# Patient Record
Sex: Male | Born: 1955 | Race: White | Hispanic: No | Marital: Married | Smoking: Current some day smoker
Health system: Southern US, Community
[De-identification: ages and names within clinical notes are randomized; demographics above are authoritative.]

## PROBLEM LIST (undated history)

## (undated) DIAGNOSIS — I4510 Unspecified right bundle-branch block: Secondary | ICD-10-CM

## (undated) DIAGNOSIS — Z9889 Other specified postprocedural states: Secondary | ICD-10-CM

## (undated) DIAGNOSIS — C801 Malignant (primary) neoplasm, unspecified: Secondary | ICD-10-CM

## (undated) DIAGNOSIS — I712 Thoracic aortic aneurysm, without rupture: Secondary | ICD-10-CM

## (undated) DIAGNOSIS — I10 Essential (primary) hypertension: Secondary | ICD-10-CM

## (undated) DIAGNOSIS — T8859XA Other complications of anesthesia, initial encounter: Secondary | ICD-10-CM

## (undated) DIAGNOSIS — I7121 Aneurysm of the ascending aorta, without rupture: Secondary | ICD-10-CM

## (undated) DIAGNOSIS — I499 Cardiac arrhythmia, unspecified: Secondary | ICD-10-CM

## (undated) DIAGNOSIS — F319 Bipolar disorder, unspecified: Secondary | ICD-10-CM

## (undated) DIAGNOSIS — T4145XA Adverse effect of unspecified anesthetic, initial encounter: Secondary | ICD-10-CM

## (undated) DIAGNOSIS — F329 Major depressive disorder, single episode, unspecified: Secondary | ICD-10-CM

## (undated) DIAGNOSIS — M1990 Unspecified osteoarthritis, unspecified site: Secondary | ICD-10-CM

## (undated) DIAGNOSIS — K219 Gastro-esophageal reflux disease without esophagitis: Secondary | ICD-10-CM

## (undated) DIAGNOSIS — R112 Nausea with vomiting, unspecified: Secondary | ICD-10-CM

## (undated) DIAGNOSIS — F32A Depression, unspecified: Secondary | ICD-10-CM

## (undated) DIAGNOSIS — Z87442 Personal history of urinary calculi: Secondary | ICD-10-CM

## (undated) HISTORY — PX: KNEE ARTHROSCOPY: SUR90

## (undated) HISTORY — PX: OTHER SURGICAL HISTORY: SHX169

## (undated) SURGERY — REPAIR, HERNIA, INGUINAL, ADULT
Anesthesia: General | Laterality: Left

---

## 1993-05-06 HISTORY — PX: ROTATOR CUFF REPAIR: SHX139

## 1999-08-20 ENCOUNTER — Encounter: Payer: Self-pay | Admitting: Family Medicine

## 1999-08-20 ENCOUNTER — Ambulatory Visit (HOSPITAL_COMMUNITY): Admission: RE | Admit: 1999-08-20 | Discharge: 1999-08-20 | Payer: Self-pay | Admitting: Family Medicine

## 2001-11-09 ENCOUNTER — Emergency Department (HOSPITAL_COMMUNITY): Admission: EM | Admit: 2001-11-09 | Discharge: 2001-11-09 | Payer: Self-pay | Admitting: Emergency Medicine

## 2001-11-12 ENCOUNTER — Observation Stay (HOSPITAL_COMMUNITY): Admission: RE | Admit: 2001-11-12 | Discharge: 2001-11-13 | Payer: Self-pay | Admitting: Orthopedic Surgery

## 2007-05-07 HISTORY — PX: PROSTATECTOMY: SHX69

## 2007-06-29 ENCOUNTER — Ambulatory Visit (HOSPITAL_COMMUNITY): Admission: RE | Admit: 2007-06-29 | Discharge: 2007-06-29 | Payer: Self-pay | Admitting: Orthopedic Surgery

## 2009-02-21 ENCOUNTER — Ambulatory Visit (HOSPITAL_BASED_OUTPATIENT_CLINIC_OR_DEPARTMENT_OTHER): Admission: RE | Admit: 2009-02-21 | Discharge: 2009-02-21 | Payer: Self-pay | Admitting: Orthopedic Surgery

## 2009-10-04 ENCOUNTER — Ambulatory Visit: Admission: RE | Admit: 2009-10-04 | Discharge: 2009-12-28 | Payer: Self-pay | Admitting: Radiation Oncology

## 2010-01-10 ENCOUNTER — Inpatient Hospital Stay (HOSPITAL_COMMUNITY): Admission: RE | Admit: 2010-01-10 | Discharge: 2010-01-11 | Payer: Self-pay | Admitting: Urology

## 2010-01-10 ENCOUNTER — Encounter (INDEPENDENT_AMBULATORY_CARE_PROVIDER_SITE_OTHER): Payer: Self-pay | Admitting: Urology

## 2010-07-19 LAB — ABO/RH: ABO/RH(D): O NEG

## 2010-07-19 LAB — BASIC METABOLIC PANEL
BUN: 12 mg/dL (ref 6–23)
BUN: 9 mg/dL (ref 6–23)
CO2: 27 mEq/L (ref 19–32)
CO2: 27 mEq/L (ref 19–32)
Calcium: 8 mg/dL — ABNORMAL LOW (ref 8.4–10.5)
Calcium: 8.4 mg/dL (ref 8.4–10.5)
Chloride: 105 mEq/L (ref 96–112)
Chloride: 108 mEq/L (ref 96–112)
Creatinine, Ser: 1.26 mg/dL (ref 0.4–1.5)
Creatinine, Ser: 1.27 mg/dL (ref 0.4–1.5)
GFR calc Af Amer: >60 mL/min (ref >60)
GFR calc Af Amer: >60 mL/min (ref >60)
GFR calc non Af Amer: 59 mL/min — ABNORMAL LOW (ref >60)
GFR calc non Af Amer: 59 mL/min — ABNORMAL LOW (ref >60)
Glucose, Bld: 136 mg/dL — ABNORMAL HIGH (ref 70–99)
Glucose, Bld: 143 mg/dL — ABNORMAL HIGH (ref 70–99)
Potassium: 4.3 mEq/L (ref 3.5–5.1)
Potassium: 4.6 mEq/L (ref 3.5–5.1)
Sodium: 137 mEq/L (ref 135–145)
Sodium: 139 mEq/L (ref 135–145)

## 2010-07-19 LAB — COMPREHENSIVE METABOLIC PANEL
Albumin: 4.2 g/dL (ref 3.5–5.2)
Alkaline Phosphatase: 58 U/L (ref 39–117)
BUN: 16 mg/dL (ref 6–23)
Creatinine, Ser: 1.4 mg/dL (ref 0.4–1.5)
Glucose, Bld: 116 mg/dL — ABNORMAL HIGH (ref 70–99)
Potassium: 4.1 mEq/L (ref 3.5–5.1)
Total Protein: 6.8 g/dL (ref 6.0–8.3)

## 2010-07-19 LAB — DIFFERENTIAL
Eosinophils Absolute: 0 10*3/uL (ref 0.0–0.7)
Eosinophils Relative: 0 % (ref 0–5)
Lymphocytes Relative: 11 % — ABNORMAL LOW (ref 12–46)
Lymphs Abs: 0.7 10*3/uL (ref 0.7–4.0)
Monocytes Absolute: 0.6 10*3/uL (ref 0.1–1.0)
Monocytes Relative: 10 % (ref 3–12)

## 2010-07-19 LAB — CBC
HCT: 31.3 % — ABNORMAL LOW (ref 39.0–52.0)
HCT: 34 % — ABNORMAL LOW (ref 39.0–52.0)
HCT: 37.5 % — ABNORMAL LOW (ref 39.0–52.0)
Hemoglobin: 10.8 g/dL — ABNORMAL LOW (ref 13.0–17.0)
Hemoglobin: 11.7 g/dL — ABNORMAL LOW (ref 13.0–17.0)
MCH: 32.6 pg (ref 26.0–34.0)
MCH: 32.7 pg (ref 26.0–34.0)
MCH: 32 pg (ref 26.0–34.0)
MCHC: 34.3 g/dL (ref 30.0–36.0)
MCHC: 34.2 g/dL (ref 30.0–36.0)
MCV: 94.2 fL (ref 78.0–100.0)
MCV: 95.2 fL (ref 78.0–100.0)
MCV: 93.6 fL (ref 78.0–100.0)
Platelets: 142 10*3/uL — ABNORMAL LOW (ref 150–400)
Platelets: 163 10*3/uL (ref 150–400)
Platelets: 187 10*3/uL (ref 150–400)
RBC: 3.32 MIL/uL — ABNORMAL LOW (ref 4.22–5.81)
RBC: 3.57 MIL/uL — ABNORMAL LOW (ref 4.22–5.81)
RDW: 13.6 % (ref 11.5–15.5)
RDW: 13.6 % (ref 11.5–15.5)
WBC: 6.2 10*3/uL (ref 4.0–10.5)
WBC: 6.5 10*3/uL (ref 4.0–10.5)
WBC: 4.4 10*3/uL (ref 4.0–10.5)

## 2010-07-19 LAB — SURGICAL PCR SCREEN
MRSA, PCR: NEGATIVE
Staphylococcus aureus: POSITIVE — AB

## 2010-07-19 LAB — TYPE AND SCREEN
ABO/RH(D): O NEG
Antibody Screen: NEGATIVE

## 2010-08-09 LAB — POCT I-STAT 4, (NA,K, GLUC, HGB,HCT)
Glucose, Bld: 115 mg/dL — ABNORMAL HIGH (ref 70–99)
HCT: 40 % (ref 39.0–52.0)
Sodium: 138 mEq/L (ref 135–145)

## 2010-09-18 NOTE — Op Note (Signed)
Philip Stout, Philip Stout                 ACCOUNT NO.:  [id number]   MEDICAL RECORD NO.:  [id number]          PATIENT TYPE:  AMB   LOCATION:  DAY                          FACILITY:  [facility]   PHYSICIAN:  Marlowe Kays, M.D.  DATE OF BIRTH:  08-25-1955   DATE OF PROCEDURE:  06/29/2007  DATE OF DISCHARGE:                               OPERATIVE REPORT   PREOPERATIVE DIAGNOSES:  1. Torn medial and lateral menisci.  2. Osteoarthritis, left knee.   POSTOPERATIVE DIAGNOSES:  1. Torn medial and lateral menisci.  2. Osteoarthritis, left knee.   OPERATION:  Left knee arthroscopy with:  1. Partial medial and lateral meniscectomy.  2. Burring down of large osteophyte from anteromedial tibial plateau.  3. Debridement and rasping of medial femoral condyle.  4. Shaving of patella, left knee.   SURGEON:  Serafina Mitchell, MD   ASSISTANT:  Nurse.   ANESTHESIA:  General.   JUSTIFICATION FOR PROCEDURE:  Stated in diagnosis.  He is having  progressive pain in the knee.  He understands that arthroscopy is more  for the torn menisci than correction of the arthritis.   PROCEDURE IN DETAIL:  Under satisfactory general anesthesia, Ace wrap,  and knee support to right lower extremity, pneumatic tourniquet to the  left lower extremity, Esmarched out nonsterilely, inflated 325 mmHg,  thigh stabilizer applied.  Left leg was prepped from stabilizer, and  ankle with DuraPrep, draped in sterile field.  Time out performed.  Superior and medial saline inflow.  First went into the lateral portal  of the mid compartment.  Knee joint was evaluated, had a good bit of  synovitis, and disruption of the anterior horn of the medial meniscus.  This was all debrided out with the 3.5 shaver to expose the large  osteophyte.  I then used the 4 mm oval bur to shave this down until  smooth.  The medial femoral condyle had diffuse wear with 1 area almost  at full thickness.  I shaved this down slightly with a 3.5 shaver and I  also  rasped it down gently.  Posteriorly he had disruption of most of  the inner border of the medial meniscus, particularly in the  intercondylar area, which I managed with a combination of baskets and  3.5 shaver.  Looking up the medial gutter and suprapatellar area, I then  shaved his patella, which was worn on both facets.  I then reversed  portals.  Laterally he had copious synovitis in the anterior portion and  large, almost bucket handle type tear of the anterior 50% of the lateral  meniscus.  I managed this by first debriding things up with better  visualization with 3.5 shaver, and I then used arthroscopic scissors to  complete the bucket handle type tear back to about the junction of the  mid posterior third.  The cut segment was resected out with a  combination of baskets and the 3.5 shaver.  The posterior third of the  lateral meniscus was also badly worn.  I debrided this back to stable  rim with final pictures being taken.  lateral  compartment wear was  minimal.  The knee joint was then irrigated until clear and all fluid  possible removed.  The standard ports were closed with 4-0 nylon.  I  injected 20 mL of 0.5% Marcaine  with adrenalin and 4 mg of morphine through the inferior apparatus,  which I removed and this portal was closed with 4-0 nylon as well.  Betadine, Adaptic, dry sterile dressings were applied.  Tourniquet was  released.  He tolerated the procedure well and was taken to recovery in  satisfactory condition with no known complications.           ______________________________  Marlowe Kays, M.D.     JA/MEDQ  D:  06/29/2007  T:  06/30/2007  Job:  [id number]

## 2010-09-21 NOTE — Op Note (Signed)
[facility]  Patient:    FILIMON, MIRANDA Visit Number: [id number] MRN: [id number]          Service Type: SUR Location: 1S 0004 01 Attending Physician:  Marlowe Kays Page Dictated by:   Illene Labrador. Aplington, M.D. Proc. Date: 11/12/01 Admit Date:  11/12/2001 Discharge Date: 11/13/2001                             Operative Report  PREOPERATIVE DIAGNOSIS:  Complete tear, left Achilles tendon.  POSTOPERATIVE DIAGNOSIS:  Complete tear, left Achilles tendon.  OPERATION PERFORMED:  Repair of left Achilles tendon rupture utilizing Graft Jacket.  SURGEON:  Illene Labrador. Aplington, M.D.  ASSISTANTJill Side P. Mahar, P.A.-C  ANESTHESIA:  General.  PATHOLOGY AND JUSTIFICATION FOR PROCEDURE:  He sustained the rupture three days ago playing tennis. The tear was about 7 cm from the distal insertion of Achilles tendon but he simply shredded the tendon with extension of disruption of the tendon for another 7 cm proximal to it. There was no healthy tissue to bring down flaps and I was able to utilize the graft jacket instead.  DESCRIPTION OF PROCEDURE:  Satisfactory general anesthesia, pneumatic tourniquet applied in the supine position, placed in the prone position on rolls, the leg was esmarched out nonsterile and tourniquet inflated to 350 mmHg. I then prepped the leg just past the knee to toes with duraprep, draped in a sterile field. I made a gentle S shaped incision with the curve at the rupture site starting superolaterally and then crossing down to the medial heel. With careful dissection I went through the subcutaneous tissue, the sensory nerves were protected. The peritenon had a good bit of hemorrhage all the way down to the insertion of the Achilles tendon. This was opened and I kept exploring until I got to normal tendon which was over 7 cm as noted above from the main rupture site. There was a few strands of tendon remaining medially. There was  also a little bit of muscle belly present on the proximal fragment. After assessing the tear, I debrided up small areas but basically there really much to debride in terms of any discreet ends. I began trying to splice the tendon together using multiple Kessler and horizontal mattress type sutures reconstituting the tendon as well as I could at the rupture site. I then went from the rupture site proximalward for the 7 cm with a running #1 Vicryl until I reconstituted the tendon muscle up to this level as well. After assessing the tear and realizing that I could not swing down any flaps for coverage, I elected to go with the right medical Graft Jacket and actually needed to use one of each size of the two sizes in order to cover the length of the disruption. The larger rectangular piece I placed over the healthy tendon distally and wrapped it around the tendon going proximalward. I then used two 3-0 Vicryl sutures to begin a perimeter stitch distally. I then used the stitch on the lateral side going back through to catch the medial portion much as a blanket surrounding the tendon and then going from medial to lateral and up the tendon until I hit muscle and then went through the tendon and muscle. Proximally I anchored the end of the graft and muscle and aponeurosis with 3-0 Vicryl as well. I then took the second smaller piece of graft and made a postage size graft  from it and placed it on the most proximal portion of the disrupted aponeurosis and sutured it down as well with 3-0 Vicryl. The wound was then well irrigated with sterile saline. There was no peritenon to close. The subcutaneous tissue was closed with a combination of 2-0 Vicryl and 3-0 Vicryl and I then approximated the skin with two running sutures of 3-0 nylon. All of this repair was done with the ankle in plantar flexion and we then placed them in the short leg plaster splint cast which was well padded with Betadine, Adaptic,  ABD pads, sterile Webril and Kerlix followed by an Ace wrap. I also infiltrated the wound with 0.5% plain Marcaine and he was given 30 mg of Toradol IV at the time of closure. The tourniquet was then released. He tolerated the procedure well and was taken to the recovery room in satisfactory condition with no known complications. Dictated by:   Illene Labrador. Aplington, M.D. Attending Physician:  Joaquin Courts DD:  11/12/01 TD:  11/16/01 Job: [id number]

## 2011-01-25 LAB — BASIC METABOLIC PANEL
BUN: 21
CO2: 29
Glucose, Bld: 91
Potassium: 4.5
Sodium: 140

## 2014-04-20 NOTE — Patient Instructions (Addendum)
Philip Stout  04/20/2014   Your procedure is scheduled on: 05/03/14   Report to [facility]  Entrance and follow signs to               [place] at 6:00 AM.   Call this number if you have problems the morning of surgery [phone]   Remember:  Do not eat food or drink liquids :After Midnight.     Take these medicines the morning of surgery with A SIP OF WATER: ALPRAZOLAM / WELLBUTRIN / OMEPRAZOLE / TYLENOL IF NEEDED                               You may not have any metal on your body including hair pins and              piercings  Do not wear jewelry, make-up, lotions, powders or perfumes.             Do not wear nail polish.  Do not shave  48 hours prior to surgery.              Men may shave face and neck.   Do not bring valuables to the hospital. [place].  Contacts, dentures or bridgework may not be worn into surgery.  Leave suitcase in the car. After surgery it may be brought to your room.     Patients discharged the day of surgery will not be allowed to drive home.  Name and phone number of your driver:  Special Instructions: N/A              Please read over the following fact sheets you were given: _____________________________________________________________________                                                     [place]  Before surgery, you can play an important role.  Because skin is not sterile, your skin needs to be as free of germs as possible.  You can reduce the number of germs on your skin by washing with CHG (chlorahexidine gluconate) soap before surgery.  CHG is an antiseptic cleaner which kills germs and bonds with the skin to continue killing germs even after washing. Please DO NOT use if you have an allergy to CHG or antibacterial soaps.  If your skin becomes reddened/irritated stop using the CHG and inform your nurse when you arrive at  Short Stay. Do not shave (including legs and underarms) for at least 48 hours prior to the first CHG shower.  You may shave your face. Please follow these instructions carefully:   1.  Shower with CHG Soap the night before surgery and the  morning of Surgery.   2.  If you choose to wash your hair, wash your hair first as usual with your  normal  Shampoo.   3.  After you shampoo, rinse your hair and body thoroughly to remove the  shampoo.  4.  Use CHG as you would any other liquid soap.  You can apply chg directly  to the skin and wash . Gently wash with scrungie or clean wascloth    5.  Apply the CHG Soap to your body ONLY FROM THE NECK DOWN.   Do not use on open                           Wound or open sores. Avoid contact with eyes, ears mouth and genitals (private parts).                        Genitals (private parts) with your normal soap.              6.  Wash thoroughly, paying special attention to the area where your surgery  will be performed.   7.  Thoroughly rinse your body with warm water from the neck down.   8.  DO NOT shower/wash with your normal soap after using and rinsing off  the CHG Soap .                9.  Pat yourself dry with a clean towel.             10.  Wear clean pajamas.             11.  Place clean sheets on your bed the night of your first shower and do not  sleep with pets.  Day of Surgery : Do not apply any lotions/deodorants the morning of surgery.  Please wear clean clothes to the hospital/surgery center.  FAILURE TO FOLLOW THESE INSTRUCTIONS MAY RESULT IN THE CANCELLATION OF YOUR SURGERY    PATIENT SIGNATURE_________________________________  ______________________________________________________________________     Philip Stout  An incentive spirometer is a tool that can help keep your lungs clear and active. This tool measures how well you are filling your lungs with each breath. Taking  long deep breaths may help reverse or decrease the chance of developing breathing (pulmonary) problems (especially infection) following:  A long period of time when you are unable to move or be active. BEFORE THE PROCEDURE   If the spirometer includes an indicator to show your best effort, your nurse or respiratory therapist will set it to a desired goal.  If possible, sit up straight or lean slightly forward. Try not to slouch.  Hold the incentive spirometer in an upright position. INSTRUCTIONS FOR USE   Sit on the edge of your bed if possible, or sit up as far as you can in bed or on a chair.  Hold the incentive spirometer in an upright position.  Breathe out normally.  Place the mouthpiece in your mouth and seal your lips tightly around it.  Breathe in slowly and as deeply as possible, raising the piston or the ball toward the top of the column.  Hold your breath for 3-5 seconds or for as long as possible. Allow the piston or ball to fall to the bottom of the column.  Remove the mouthpiece from your mouth and breathe out normally.  Rest for a few seconds and repeat Steps 1 through 7 at least 10 times every 1-2 hours when you are awake. Take your time and take a few normal breaths between deep breaths.  The spirometer may include an indicator to show your best effort. Use the indicator as a goal to work toward during  each repetition.  After each set of 10 deep breaths, practice coughing to be sure your lungs are clear. If you have an incision (the cut made at the time of surgery), support your incision when coughing by placing a pillow or rolled up towels firmly against it. Once you are able to get out of bed, walk around indoors and cough well. You may stop using the incentive spirometer when instructed by your caregiver.  RISKS AND COMPLICATIONS  Take your time so you do not get dizzy or light-headed.  If you are in pain, you may need to take or ask for pain medication before  doing incentive spirometry. It is harder to take a deep breath if you are having pain. AFTER USE  Rest and breathe slowly and easily.  It can be helpful to keep track of a log of your progress. Your caregiver can provide you with a simple table to help with this. If you are using the spirometer at home, follow these instructions: [place] IF:   You are having difficultly using the spirometer.  You have trouble using the spirometer as often as instructed.  Your pain medication is not giving enough relief while using the spirometer.  You develop fever of 100.5 F (38.1 C) or higher. SEEK IMMEDIATE MEDICAL CARE IF:   You cough up bloody sputum that had not been present before.  You develop fever of 102 F (38.9 C) or greater.  You develop worsening pain at or near the incision site. MAKE SURE YOU:   Understand these instructions.  Will watch your condition.  Will get help right away if you are not doing well or get worse. Document Released: 09/02/2006 Document Revised: 07/15/2011 Document Reviewed: 11/03/2006 [employer] Patient Information 2014 [employer], [place].   ________________________________________________________________________  WHAT IS A BLOOD TRANSFUSION? Blood Transfusion Information  A transfusion is the replacement of blood or some of its parts. Blood is made up of multiple cells which provide different functions.  Red blood cells carry oxygen and are used for blood loss replacement.  White blood cells fight against infection.  Platelets control bleeding.  Plasma helps clot blood.  Other blood products are available for specialized needs, such as hemophilia or other clotting disorders. BEFORE THE TRANSFUSION  Who gives blood for transfusions?   Healthy volunteers who are fully evaluated to make sure their blood is safe. This is blood bank blood. Transfusion therapy is the safest it has ever been in the practice of medicine. Before blood is taken  from a donor, a complete history is taken to make sure that person has no history of diseases nor engages in risky social behavior (examples are intravenous drug use or sexual activity with multiple partners). The donor's travel history is screened to minimize risk of transmitting infections, such as malaria. The donated blood is tested for signs of infectious diseases, such as HIV and hepatitis. The blood is then tested to be sure it is compatible with you in order to minimize the chance of a transfusion reaction. If you or a relative donates blood, this is often done in anticipation of surgery and is not appropriate for emergency situations. It takes many days to process the donated blood. RISKS AND COMPLICATIONS Although transfusion therapy is very safe and saves many lives, the main dangers of transfusion include:   Getting an infectious disease.  Developing a transfusion reaction. This is an allergic reaction to something in the blood you were given. Every precaution is taken to prevent  this. The decision to have a blood transfusion has been considered carefully by your caregiver before blood is given. Blood is not given unless the benefits outweigh the risks. AFTER THE TRANSFUSION  Right after receiving a blood transfusion, you will usually feel much better and more energetic. This is especially true if your red blood cells have gotten low (anemic). The transfusion raises the level of the red blood cells which carry oxygen, and this usually causes an energy increase.  The nurse administering the transfusion will monitor you carefully for complications. HOME CARE INSTRUCTIONS  No special instructions are needed after a transfusion. You may find your energy is better. Speak with your caregiver about any limitations on activity for underlying diseases you may have. SEEK MEDICAL CARE IF:   Your condition is not improving after your transfusion.  You develop redness or irritation at the  intravenous (IV) site. SEEK IMMEDIATE MEDICAL CARE IF:  Any of the following symptoms occur over the next 12 hours:  Shaking chills.  You have a temperature by mouth above 102 F (38.9 C), not controlled by medicine.  Chest, back, or muscle pain.  People around you feel you are not acting correctly or are confused.  Shortness of breath or difficulty breathing.  Dizziness and fainting.  You get a rash or develop hives.  You have a decrease in urine output.  Your urine turns a dark color or changes to pink, red, or brown. Any of the following symptoms occur over the next 10 days:  You have a temperature by mouth above 102 F (38.9 C), not controlled by medicine.  Shortness of breath.  Weakness after normal activity.  The white part of the eye turns yellow (jaundice).  You have a decrease in the amount of urine or are urinating less often.  Your urine turns a dark color or changes to pink, red, or brown. Document Released: 04/19/2000 Document Revised: 07/15/2011 Document Reviewed: 12/07/2007 [facility] Patient Information 2014 [place], [place].  _______________________________________________________________________

## 2014-04-20 NOTE — H&P (Signed)
TOTAL HIP ADMISSION H&P  Patient is admitted for right total hip arthroplasty, anterior approach.  Subjective:  Chief Complaint:   Right hip OA / pain  HPI: Philip Stout, 58 y.o. male, has a history of pain and functional disability in the right hip(s) due to arthritis and patient has failed non-surgical conservative treatments for greater than 12 weeks to include NSAID's and/or analgesics, corticosteriod injections, use of assistive devices and activity modification.  Onset of symptoms was gradual starting 2+ years ago with gradually worsening course since that time.The patient noted no past surgery on the right hip(s).  Patient currently rates pain in the right hip at 9 out of 10 with activity. Patient has night pain, worsening of pain with activity and weight bearing, trendelenberg gait, pain that interfers with activities of daily living and pain with passive range of motion. Patient has evidence of periarticular osteophytes and joint space narrowing by imaging studies. This condition presents safety issues increasing the risk of falls.  There is no current active infection.  Risks, benefits and expectations were discussed with the patient.  Risks including but not limited to the risk of anesthesia, blood clots, nerve damage, blood vessel damage, failure of the prosthesis, infection and up to and including death.  Patient understand the risks, benefits and expectations and wishes to proceed with surgery.   PCP: Vena Austria, MD  D/C Plans:      Home with HHPT  Post-op Meds:       No Rx given  Tranexamic Acid:      To be given - IV   Decadron:      Is to be given  FYI:     ASA post-op  Norco post-op    There are no active problems to display for this patient.  Past Medical History  Diagnosis Date  . Complication of anesthesia   . PONV (postoperative nausea and vomiting)   . Hypertension   . Arthritis   . GERD (gastroesophageal reflux disease)   . Cancer     HX PROSTATE  CANCER  . History of kidney stones   . Bipolar disorder   . Depression     Past Surgical History  Procedure Laterality Date  . Rotator cuff repair  1995    RT SHOULDER  . Knee arthroscopy      BILATERAL  . Axchilles tendon      REPAIR  . Prostatectomy  2009    No prescriptions prior to admission   No Known Allergies   History  Substance Use Topics  . Smoking status: Former Smoker    Quit date: 04/21/2006  . Smokeless tobacco: Not on file  . Alcohol Use: No    No family history on file.   Review of Systems  Constitutional: Negative.   HENT: Negative.   Eyes: Negative.   Respiratory: Negative.   Cardiovascular: Negative.   Gastrointestinal: Positive for heartburn and constipation.  Genitourinary: Negative.   Musculoskeletal: Positive for joint pain.  Skin: Negative.   Neurological: Negative.   Endo/Heme/Allergies: Negative.   Psychiatric/Behavioral: Positive for depression.    Objective:  Physical Exam  Constitutional: He is oriented to person, place, and time. He appears well-developed and well-nourished.  HENT:  Head: Normocephalic and atraumatic.  Eyes: Pupils are equal, round, and reactive to light.  Neck: Neck supple. No JVD present. No tracheal deviation present. No thyromegaly present.  Cardiovascular: Normal rate, regular rhythm, normal heart sounds and intact distal pulses.   Respiratory: Effort normal  and breath sounds normal. No stridor. No respiratory distress. He has no wheezes.  GI: Soft. There is no tenderness. There is no guarding.  Musculoskeletal:       Right hip: He exhibits decreased range of motion, decreased strength, tenderness and bony tenderness. He exhibits no swelling, no deformity and no laceration.  Lymphadenopathy:    He has no cervical adenopathy.  Neurological: He is alert and oriented to person, place, and time.  Skin: Skin is warm and dry.  Psychiatric: He has a normal mood and affect.     Imaging Review Plain  radiographs demonstrate severe degenerative joint disease of the right hip(s). The bone quality appears to be good for age and reported activity level.  Assessment/Plan:  End stage arthritis, right hip(s)  The patient history, physical examination, clinical judgement of the provider and imaging studies are consistent with end stage degenerative joint disease of the right hip(s) and total hip arthroplasty is deemed medically necessary. The treatment options including medical management, injection therapy, arthroscopy and arthroplasty were discussed at length. The risks and benefits of total hip arthroplasty were presented and reviewed. The risks due to aseptic loosening, infection, stiffness, dislocation/subluxation,  thromboembolic complications and other imponderables were discussed.  The patient acknowledged the explanation, agreed to proceed with the plan and consent was signed. Patient is being admitted for inpatient treatment for surgery, pain control, PT, OT, prophylactic antibiotics, VTE prophylaxis, progressive ambulation and ADL's and discharge planning.The patient is planning to be discharged home with home health services.    Philip Pugh Breckyn Troyer   PA-C  04/25/2014, 8:38 PM

## 2014-04-21 ENCOUNTER — Other Ambulatory Visit: Payer: Self-pay

## 2014-04-21 ENCOUNTER — Encounter (HOSPITAL_COMMUNITY): Payer: Self-pay

## 2014-04-21 ENCOUNTER — Encounter (HOSPITAL_COMMUNITY)
Admission: RE | Admit: 2014-04-21 | Discharge: 2014-04-21 | Disposition: A | Discharge status: Home / Self Care | Payer: BC Managed Care – PPO | Source: Ambulatory Visit | Attending: Orthopedic Surgery | Admitting: Orthopedic Surgery

## 2014-04-21 ENCOUNTER — Ambulatory Visit (HOSPITAL_COMMUNITY)
Admission: RE | Admit: 2014-04-21 | Discharge: 2014-04-21 | Disposition: A | Discharge status: Home / Self Care | Payer: BC Managed Care – PPO | Source: Ambulatory Visit | Attending: Anesthesiology | Admitting: Anesthesiology

## 2014-04-21 DIAGNOSIS — I771 Stricture of artery: Secondary | ICD-10-CM | POA: Diagnosis not present

## 2014-04-21 DIAGNOSIS — I10 Essential (primary) hypertension: Secondary | ICD-10-CM | POA: Insufficient documentation

## 2014-04-21 DIAGNOSIS — Z01818 Encounter for other preprocedural examination: Secondary | ICD-10-CM | POA: Insufficient documentation

## 2014-04-21 HISTORY — DX: Bipolar disorder, unspecified: F31.9

## 2014-04-21 HISTORY — DX: Gastro-esophageal reflux disease without esophagitis: K21.9

## 2014-04-21 HISTORY — DX: Adverse effect of unspecified anesthetic, initial encounter: T41.45XA

## 2014-04-21 HISTORY — DX: Depression, unspecified: F32.A

## 2014-04-21 HISTORY — DX: Nausea with vomiting, unspecified: R11.2

## 2014-04-21 HISTORY — DX: Personal history of urinary calculi: Z87.442

## 2014-04-21 HISTORY — DX: Nausea with vomiting, unspecified: Z98.890

## 2014-04-21 HISTORY — DX: Unspecified osteoarthritis, unspecified site: M19.90

## 2014-04-21 HISTORY — DX: Major depressive disorder, single episode, unspecified: F32.9

## 2014-04-21 HISTORY — DX: Malignant (primary) neoplasm, unspecified: C80.1

## 2014-04-21 HISTORY — DX: Essential (primary) hypertension: I10

## 2014-04-21 HISTORY — DX: Other complications of anesthesia, initial encounter: T88.59XA

## 2014-04-21 LAB — CBC
HEMATOCRIT: 39.5 % (ref 39.0–52.0)
Hemoglobin: 13.3 g/dL (ref 13.0–17.0)
MCH: 30.4 pg (ref 26.0–34.0)
MCHC: 33.7 g/dL (ref 30.0–36.0)
MCV: 90.2 fL (ref 78.0–100.0)
PLATELETS: 191 10*3/uL (ref 150–400)
RBC: 4.38 MIL/uL (ref 4.22–5.81)
RDW: 12.6 % (ref 11.5–15.5)
WBC: 6.6 10*3/uL (ref 4.0–10.5)

## 2014-04-21 LAB — URINALYSIS, ROUTINE W REFLEX MICROSCOPIC
Bilirubin Urine: NEGATIVE
Glucose, UA: NEGATIVE mg/dL
HGB URINE DIPSTICK: NEGATIVE
Ketones, ur: NEGATIVE mg/dL
LEUKOCYTES UA: NEGATIVE
Nitrite: NEGATIVE
PH: 6.5 (ref 5.0–8.0)
Protein, ur: NEGATIVE mg/dL
Specific Gravity, Urine: 1.018 (ref 1.005–1.030)
Urobilinogen, UA: 1 mg/dL (ref 0.0–1.0)

## 2014-04-21 LAB — BASIC METABOLIC PANEL
Anion gap: 11 (ref 5–15)
BUN: 23 mg/dL (ref 6–23)
CALCIUM: 9.6 mg/dL (ref 8.4–10.5)
CO2: 27 meq/L (ref 19–32)
Chloride: 96 mEq/L (ref 96–112)
Creatinine, Ser: 1.3 mg/dL (ref 0.50–1.35)
GFR calc Af Amer: 68 mL/min — ABNORMAL LOW (ref >90)
GFR, EST NON AFRICAN AMERICAN: 59 mL/min — AB (ref >90)
Glucose, Bld: 137 mg/dL — ABNORMAL HIGH (ref 70–99)
Potassium: 4.1 mEq/L (ref 3.7–5.3)
SODIUM: 134 meq/L — AB (ref 137–147)

## 2014-04-21 LAB — PROTIME-INR
INR: 1.02 (ref 0.00–1.49)
Prothrombin Time: 13.6 seconds (ref 11.6–15.2)

## 2014-04-21 LAB — SURGICAL PCR SCREEN
MRSA, PCR: NEGATIVE
Staphylococcus aureus: NEGATIVE

## 2014-04-21 LAB — APTT: aPTT: 34 seconds (ref 24–37)

## 2014-04-21 NOTE — Progress Notes (Signed)
   04/21/14 1136  OBSTRUCTIVE SLEEP APNEA  Have you ever been diagnosed with sleep apnea through a sleep study? No  Do you snore loudly (loud enough to be heard through closed doors)?  0  Do you often feel tired, fatigued, or sleepy during the daytime? 0  Has anyone observed you stop breathing during your sleep? 0  Do you have, or are you being treated for high blood pressure? 1  BMI more than 35 kg/m2? 0  Age over 59 years old? 1  Neck circumference greater than 40 cm/16 inches? 1  Gender: 1  Obstructive Sleep Apnea Score 4  Score 4 or greater  Results sent to PCP

## 2014-05-03 ENCOUNTER — Inpatient Hospital Stay (HOSPITAL_COMMUNITY): Payer: BC Managed Care – PPO

## 2014-05-03 ENCOUNTER — Encounter (HOSPITAL_COMMUNITY)
Admission: RE | Disposition: A | Discharge status: Home / Self Care | Payer: Self-pay | Source: Ambulatory Visit | Attending: Orthopedic Surgery

## 2014-05-03 ENCOUNTER — Inpatient Hospital Stay (HOSPITAL_COMMUNITY)
Admission: RE | Admit: 2014-05-03 | Discharge: 2014-05-04 | DRG: 470 | Disposition: A | Discharge status: Home / Self Care | Payer: BC Managed Care – PPO | Source: Ambulatory Visit | Attending: Orthopedic Surgery | Admitting: Orthopedic Surgery

## 2014-05-03 ENCOUNTER — Encounter (HOSPITAL_COMMUNITY): Payer: Self-pay | Admitting: *Deleted

## 2014-05-03 ENCOUNTER — Inpatient Hospital Stay (HOSPITAL_COMMUNITY): Payer: BC Managed Care – PPO | Admitting: Anesthesiology

## 2014-05-03 DIAGNOSIS — F319 Bipolar disorder, unspecified: Secondary | ICD-10-CM | POA: Diagnosis present

## 2014-05-03 DIAGNOSIS — M1611 Unilateral primary osteoarthritis, right hip: Secondary | ICD-10-CM | POA: Diagnosis present

## 2014-05-03 DIAGNOSIS — Z87891 Personal history of nicotine dependence: Secondary | ICD-10-CM | POA: Diagnosis not present

## 2014-05-03 DIAGNOSIS — Z87442 Personal history of urinary calculi: Secondary | ICD-10-CM | POA: Diagnosis not present

## 2014-05-03 DIAGNOSIS — K219 Gastro-esophageal reflux disease without esophagitis: Secondary | ICD-10-CM | POA: Diagnosis present

## 2014-05-03 DIAGNOSIS — Z8546 Personal history of malignant neoplasm of prostate: Secondary | ICD-10-CM

## 2014-05-03 DIAGNOSIS — Z96649 Presence of unspecified artificial hip joint: Secondary | ICD-10-CM

## 2014-05-03 DIAGNOSIS — M25551 Pain in right hip: Secondary | ICD-10-CM | POA: Diagnosis present

## 2014-05-03 DIAGNOSIS — I10 Essential (primary) hypertension: Secondary | ICD-10-CM | POA: Diagnosis present

## 2014-05-03 HISTORY — PX: TOTAL HIP ARTHROPLASTY: SHX124

## 2014-05-03 LAB — TYPE AND SCREEN
ABO/RH(D): O NEG
Antibody Screen: NEGATIVE

## 2014-05-03 SURGERY — ARTHROPLASTY, HIP, TOTAL, ANTERIOR APPROACH
Anesthesia: Spinal | Site: Hip | Laterality: Right

## 2014-05-03 MED ORDER — CEFAZOLIN SODIUM-DEXTROSE 2-3 GM-% IV SOLR
2.0000 g | INTRAVENOUS | Status: AC
  Administered 2014-05-03: 2 g via INTRAVENOUS

## 2014-05-03 MED ORDER — DIPHENHYDRAMINE HCL 25 MG PO CAPS: 25.0000 mg | ORAL_CAPSULE | Freq: Four times a day (QID) | ORAL | Status: DC | PRN

## 2014-05-03 MED ORDER — PHENOL 1.4 % MT LIQD: 1.0000 | OROMUCOSAL | Status: DC | PRN

## 2014-05-03 MED ORDER — MIDAZOLAM HCL 5 MG/5ML IJ SOLN
INTRAMUSCULAR | Status: DC | PRN
  Administered 2014-05-03: 2 mg via INTRAVENOUS

## 2014-05-03 MED ORDER — HYDROCODONE-ACETAMINOPHEN 7.5-325 MG PO TABS
1.0000 | ORAL_TABLET | ORAL | Status: DC
  Administered 2014-05-03 – 2014-05-04 (×4): 2 via ORAL
  Administered 2014-05-04: 1 via ORAL
  Administered 2014-05-04: 2 via ORAL
  Filled 2014-05-03: qty 1
  Filled 2014-05-03 (×5): qty 2

## 2014-05-03 MED ORDER — BUPIVACAINE HCL (PF) 0.5 % IJ SOLN
INTRAMUSCULAR | Status: DC | PRN
  Administered 2014-05-03: 3 mL

## 2014-05-03 MED ORDER — PROMETHAZINE HCL 25 MG/ML IJ SOLN: 6.2500 mg | INTRAMUSCULAR | Status: DC | PRN

## 2014-05-03 MED ORDER — CELECOXIB 200 MG PO CAPS
200.0000 mg | ORAL_CAPSULE | Freq: Two times a day (BID) | ORAL | Status: DC
  Administered 2014-05-03 – 2014-05-04 (×2): 200 mg via ORAL
  Filled 2014-05-03 (×3): qty 1

## 2014-05-03 MED ORDER — BISACODYL 10 MG RE SUPP: 10.0000 mg | Freq: Every day | RECTAL | Status: DC | PRN

## 2014-05-03 MED ORDER — PHENYLEPHRINE 40 MCG/ML (10ML) SYRINGE FOR IV PUSH (FOR BLOOD PRESSURE SUPPORT)
PREFILLED_SYRINGE | INTRAVENOUS | Status: AC
  Filled 2014-05-03: qty 10

## 2014-05-03 MED ORDER — DEXAMETHASONE SODIUM PHOSPHATE 10 MG/ML IJ SOLN
10.0000 mg | Freq: Once | INTRAMUSCULAR | Status: AC
  Administered 2014-05-03: 10 mg via INTRAVENOUS

## 2014-05-03 MED ORDER — FENTANYL CITRATE 0.05 MG/ML IJ SOLN
INTRAMUSCULAR | Status: AC
  Filled 2014-05-03: qty 2

## 2014-05-03 MED ORDER — DOCUSATE SODIUM 100 MG PO CAPS
100.0000 mg | ORAL_CAPSULE | Freq: Two times a day (BID) | ORAL | Status: DC
  Administered 2014-05-03 – 2014-05-04 (×2): 100 mg via ORAL

## 2014-05-03 MED ORDER — RISPERIDONE 2 MG PO TABS
2.0000 mg | ORAL_TABLET | Freq: Every day | ORAL | Status: DC
  Administered 2014-05-03: 2 mg via ORAL
  Filled 2014-05-03 (×2): qty 1

## 2014-05-03 MED ORDER — METOCLOPRAMIDE HCL 10 MG PO TABS: 5.0000 mg | ORAL_TABLET | Freq: Three times a day (TID) | ORAL | Status: DC | PRN

## 2014-05-03 MED ORDER — LACTATED RINGERS IV SOLN
INTRAVENOUS | Status: DC | PRN
  Administered 2014-05-03 (×3): via INTRAVENOUS

## 2014-05-03 MED ORDER — PROPOFOL INFUSION 10 MG/ML OPTIME
INTRAVENOUS | Status: DC | PRN
  Administered 2014-05-03: 80 ug/kg/min via INTRAVENOUS

## 2014-05-03 MED ORDER — MEPERIDINE HCL 50 MG/ML IJ SOLN: 6.2500 mg | INTRAMUSCULAR | Status: DC | PRN

## 2014-05-03 MED ORDER — ALUM & MAG HYDROXIDE-SIMETH 200-200-20 MG/5ML PO SUSP
30.0000 mL | ORAL | Status: DC | PRN
  Administered 2014-05-03: 30 mL via ORAL
  Filled 2014-05-03: qty 30

## 2014-05-03 MED ORDER — HYDROMORPHONE HCL 1 MG/ML IJ SOLN
INTRAMUSCULAR | Status: AC
  Administered 2014-05-03: 1 mg
  Filled 2014-05-03: qty 1

## 2014-05-03 MED ORDER — ONDANSETRON HCL 4 MG PO TABS: 4.0000 mg | ORAL_TABLET | Freq: Four times a day (QID) | ORAL | Status: DC | PRN

## 2014-05-03 MED ORDER — CHLORHEXIDINE GLUCONATE 4 % EX LIQD: 60.0000 mL | Freq: Once | CUTANEOUS | Status: DC

## 2014-05-03 MED ORDER — TRANEXAMIC ACID 100 MG/ML IV SOLN
1000.0000 mg | Freq: Once | INTRAVENOUS | Status: AC
  Administered 2014-05-03: 1000 mg via INTRAVENOUS
  Filled 2014-05-03: qty 10

## 2014-05-03 MED ORDER — POLYETHYLENE GLYCOL 3350 17 G PO PACK
17.0000 g | PACK | Freq: Two times a day (BID) | ORAL | Status: DC
  Administered 2014-05-03 – 2014-05-04 (×2): 17 g via ORAL

## 2014-05-03 MED ORDER — HYDROMORPHONE HCL 1 MG/ML IJ SOLN
0.2500 mg | INTRAMUSCULAR | Status: DC | PRN
  Administered 2014-05-03: 0.25 mg via INTRAVENOUS

## 2014-05-03 MED ORDER — LIDOCAINE HCL (CARDIAC) 20 MG/ML IV SOLN
INTRAVENOUS | Status: AC
  Filled 2014-05-03: qty 5

## 2014-05-03 MED ORDER — METHOCARBAMOL 500 MG PO TABS
500.0000 mg | ORAL_TABLET | Freq: Four times a day (QID) | ORAL | Status: DC | PRN
  Administered 2014-05-03 – 2014-05-04 (×2): 500 mg via ORAL
  Filled 2014-05-03 (×2): qty 1

## 2014-05-03 MED ORDER — FENTANYL CITRATE 0.05 MG/ML IJ SOLN
INTRAMUSCULAR | Status: DC | PRN
  Administered 2014-05-03: 100 ug via INTRAVENOUS

## 2014-05-03 MED ORDER — PROPOFOL 10 MG/ML IV BOLUS
INTRAVENOUS | Status: AC
  Filled 2014-05-03: qty 20

## 2014-05-03 MED ORDER — HYDROMORPHONE HCL 1 MG/ML IJ SOLN
0.5000 mg | INTRAMUSCULAR | Status: DC | PRN
  Administered 2014-05-03: 0.5 mg via INTRAVENOUS
  Administered 2014-05-03 (×3): 1 mg via INTRAVENOUS
  Filled 2014-05-03 (×4): qty 1

## 2014-05-03 MED ORDER — ALPRAZOLAM 0.25 MG PO TABS: 0.2500 mg | ORAL_TABLET | Freq: Two times a day (BID) | ORAL | Status: DC | PRN

## 2014-05-03 MED ORDER — LAMOTRIGINE 200 MG PO TABS
400.0000 mg | ORAL_TABLET | Freq: Every day | ORAL | Status: DC
  Administered 2014-05-03: 400 mg via ORAL
  Filled 2014-05-03 (×2): qty 2

## 2014-05-03 MED ORDER — POTASSIUM CHLORIDE 2 MEQ/ML IV SOLN
100.0000 mL/h | INTRAVENOUS | Status: DC
  Administered 2014-05-03: 100 mL/h via INTRAVENOUS
  Filled 2014-05-03 (×5): qty 1000

## 2014-05-03 MED ORDER — PANTOPRAZOLE SODIUM 40 MG PO TBEC
40.0000 mg | DELAYED_RELEASE_TABLET | Freq: Every day | ORAL | Status: DC
  Administered 2014-05-04: 40 mg via ORAL
  Filled 2014-05-03: qty 1

## 2014-05-03 MED ORDER — LACTATED RINGERS IV SOLN
INTRAVENOUS | Status: DC
  Administered 2014-05-03: 12:00:00 via INTRAVENOUS

## 2014-05-03 MED ORDER — DEXTROSE 5 % IV SOLN
500.0000 mg | Freq: Four times a day (QID) | INTRAVENOUS | Status: DC | PRN
  Administered 2014-05-03: 500 mg via INTRAVENOUS
  Filled 2014-05-03 (×2): qty 5

## 2014-05-03 MED ORDER — 0.9 % SODIUM CHLORIDE (POUR BTL) OPTIME
TOPICAL | Status: DC | PRN
  Administered 2014-05-03: 1000 mL

## 2014-05-03 MED ORDER — DEXAMETHASONE SODIUM PHOSPHATE 10 MG/ML IJ SOLN
10.0000 mg | Freq: Once | INTRAMUSCULAR | Status: DC
  Filled 2014-05-03: qty 1

## 2014-05-03 MED ORDER — BUPIVACAINE HCL (PF) 0.5 % IJ SOLN
INTRAMUSCULAR | Status: AC
  Filled 2014-05-03: qty 30

## 2014-05-03 MED ORDER — FERROUS SULFATE 325 (65 FE) MG PO TABS
325.0000 mg | ORAL_TABLET | Freq: Three times a day (TID) | ORAL | Status: DC
  Administered 2014-05-04: 325 mg via ORAL
  Filled 2014-05-03 (×5): qty 1

## 2014-05-03 MED ORDER — METOCLOPRAMIDE HCL 5 MG/ML IJ SOLN
5.0000 mg | Freq: Three times a day (TID) | INTRAMUSCULAR | Status: DC | PRN
Start: 2014-05-03 — End: 2014-05-04
  Administered 2014-05-03 – 2014-05-04 (×2): 10 mg via INTRAVENOUS
  Filled 2014-05-03 (×2): qty 2

## 2014-05-03 MED ORDER — BUPROPION HCL ER (XL) 300 MG PO TB24
450.0000 mg | ORAL_TABLET | Freq: Every morning | ORAL | Status: DC
  Administered 2014-05-04: 450 mg via ORAL
  Filled 2014-05-03: qty 1

## 2014-05-03 MED ORDER — ASPIRIN EC 325 MG PO TBEC
325.0000 mg | DELAYED_RELEASE_TABLET | Freq: Two times a day (BID) | ORAL | Status: DC
  Administered 2014-05-04: 325 mg via ORAL
  Filled 2014-05-03 (×3): qty 1

## 2014-05-03 MED ORDER — PROPOFOL 10 MG/ML IV BOLUS
INTRAVENOUS | Status: AC
Start: 2014-05-03 — End: 2014-05-03
  Filled 2014-05-03: qty 20

## 2014-05-03 MED ORDER — MAGNESIUM CITRATE PO SOLN: 1.0000 | Freq: Once | ORAL | Status: AC | PRN

## 2014-05-03 MED ORDER — CEFAZOLIN SODIUM-DEXTROSE 2-3 GM-% IV SOLR
2.0000 g | Freq: Four times a day (QID) | INTRAVENOUS | Status: AC
  Administered 2014-05-03 (×2): 2 g via INTRAVENOUS
  Filled 2014-05-03 (×2): qty 50

## 2014-05-03 MED ORDER — ONDANSETRON HCL 4 MG/2ML IJ SOLN
INTRAMUSCULAR | Status: DC | PRN
  Administered 2014-05-03: 4 mg via INTRAVENOUS

## 2014-05-03 MED ORDER — MENTHOL 3 MG MT LOZG: 1.0000 | LOZENGE | OROMUCOSAL | Status: DC | PRN

## 2014-05-03 MED ORDER — CEFAZOLIN SODIUM-DEXTROSE 2-3 GM-% IV SOLR
INTRAVENOUS | Status: AC
  Filled 2014-05-03: qty 50

## 2014-05-03 MED ORDER — ONDANSETRON HCL 4 MG/2ML IJ SOLN
4.0000 mg | Freq: Four times a day (QID) | INTRAMUSCULAR | Status: DC | PRN
  Administered 2014-05-03 – 2014-05-04 (×2): 4 mg via INTRAVENOUS
  Filled 2014-05-03 (×2): qty 2

## 2014-05-03 MED ORDER — MIDAZOLAM HCL 2 MG/2ML IJ SOLN
INTRAMUSCULAR | Status: AC
  Filled 2014-05-03: qty 2

## 2014-05-03 MED ORDER — PHENYLEPHRINE HCL 10 MG/ML IJ SOLN
INTRAMUSCULAR | Status: DC | PRN
  Administered 2014-05-03 (×3): 80 ug via INTRAVENOUS

## 2014-05-03 SURGICAL SUPPLY — 40 items
BAG ZIPLOCK 12X15 (MISCELLANEOUS) IMPLANT
CAPT HIP TOTAL 2 ×3 IMPLANT
COVER PERINEAL POST (MISCELLANEOUS) ×3 IMPLANT
DRAPE C-ARM 42X120 X-RAY (DRAPES) ×3 IMPLANT
DRAPE STERI IOBAN 125X83 (DRAPES) ×3 IMPLANT
DRAPE U-SHAPE 47X51 STRL (DRAPES) ×9 IMPLANT
DRSG AQUACEL AG ADV 3.5X10 (GAUZE/BANDAGES/DRESSINGS) ×3 IMPLANT
DURAPREP 26ML APPLICATOR (WOUND CARE) ×3 IMPLANT
ELECT BLADE TIP CTD 4 INCH (ELECTRODE) ×3 IMPLANT
ELECT REM PT RETURN 9FT ADLT (ELECTROSURGICAL) ×3
ELECTRODE REM PT RTRN 9FT ADLT (ELECTROSURGICAL) ×1 IMPLANT
FACESHIELD WRAPAROUND (MASK) ×12 IMPLANT
GLOVE BIO SURGEON STRL SZ7 (GLOVE) ×3 IMPLANT
GLOVE BIO SURGEON STRL SZ8 (GLOVE) ×3 IMPLANT
GLOVE BIOGEL M 7.0 STRL (GLOVE) ×3 IMPLANT
GLOVE BIOGEL PI IND STRL 6.5 (GLOVE) ×1 IMPLANT
GLOVE BIOGEL PI IND STRL 7.0 (GLOVE) ×1 IMPLANT
GLOVE BIOGEL PI IND STRL 7.5 (GLOVE) ×2 IMPLANT
GLOVE BIOGEL PI IND STRL 8.5 (GLOVE) ×1 IMPLANT
GLOVE BIOGEL PI INDICATOR 6.5 (GLOVE) ×2
GLOVE BIOGEL PI INDICATOR 7.0 (GLOVE) ×2
GLOVE BIOGEL PI INDICATOR 7.5 (GLOVE) ×4
GLOVE BIOGEL PI INDICATOR 8.5 (GLOVE) ×2
GLOVE ORTHO TXT STRL SZ7.5 (GLOVE) ×3 IMPLANT
GOWN STRL REUS W/TWL LRG LVL3 (GOWN DISPOSABLE) ×3 IMPLANT
GOWN STRL REUS W/TWL XL LVL3 (GOWN DISPOSABLE) ×9 IMPLANT
HOLDER FOLEY CATH W/STRAP (MISCELLANEOUS) ×3 IMPLANT
KIT BASIN OR (CUSTOM PROCEDURE TRAY) ×3 IMPLANT
LIQUID BAND (GAUZE/BANDAGES/DRESSINGS) ×3 IMPLANT
PACK TOTAL JOINT (CUSTOM PROCEDURE TRAY) ×3 IMPLANT
SAW OSC TIP CART 19.5X105X1.3 (SAW) ×3 IMPLANT
SUT MNCRL AB 4-0 PS2 18 (SUTURE) ×3 IMPLANT
SUT VIC AB 1 CT1 36 (SUTURE) ×9 IMPLANT
SUT VIC AB 2-0 CT1 27 (SUTURE) ×4
SUT VIC AB 2-0 CT1 TAPERPNT 27 (SUTURE) ×2 IMPLANT
SUT VLOC 180 0 24IN GS25 (SUTURE) ×3 IMPLANT
TOWEL OR 17X26 10 PK STRL BLUE (TOWEL DISPOSABLE) ×3 IMPLANT
TOWEL OR NON WOVEN STRL DISP B (DISPOSABLE) ×3 IMPLANT
TRAY FOLEY CATH 16FRSI W/METER (SET/KITS/TRAYS/PACK) ×3 IMPLANT
WATER STERILE IRR 1500ML POUR (IV SOLUTION) ×3 IMPLANT

## 2014-05-03 NOTE — Evaluation (Signed)
Physical Therapy Evaluation Patient Details Name: Philip Stout MRN: [id number] DOB: 1955-07-18 Today's Date: 05/03/2014   History of Present Illness  58 yo male s/p R THA-direct anterior 05/02/14.   Clinical Impression  On eval, pt required Min guard assist for mobility-able to ambulate ~50 feet with use of RW. Limited by pain-rated 4/10 at rest, 7/10 with activity. Pt states plan is for d/c home on tomorrow.     Follow Up Recommendations Home health PT    Equipment Recommendations  Rolling walker with 5" wheels    Recommendations for Other Services OT consult     Precautions / Restrictions Precautions Precautions: None Restrictions Weight Bearing Restrictions: No RLE Weight Bearing: Weight bearing as tolerated      Mobility  Bed Mobility Overal bed mobility: Needs Assistance Bed Mobility: Supine to Sit;Sit to Supine     Supine to sit: Min assist Sit to supine: Min assist   General bed mobility comments: Assist for R LE off/onto bed. Increased time.   Transfers Overall transfer level: Needs assistance Equipment used: Rolling walker (2 wheeled) Transfers: Sit to/from Stand Sit to Stand: Min guard         General transfer comment: close guard for safety. VCs safety, hand placement  Ambulation/Gait Ambulation/Gait assistance: Min guard Ambulation Distance (Feet): 50 Feet Assistive device: Rolling walker (2 wheeled) Gait Pattern/deviations: Step-to pattern;Decreased stride length;Antalgic     General Gait Details: Noted IR of R foot. slow gait speed. VCs safety, sequence. close guard for safety. Increased pain with ambulation-from 4/10 to 7/10  Stairs            Wheelchair Mobility    Modified Rankin (Stroke Patients Only)       Balance                                             Pertinent Vitals/Pain Pain Assessment: 0-10 Pain Score: 7  Pain Location: R hip/thigh with activity Pain Descriptors / Indicators:  Aching;Burning;Sore Pain Intervention(s): Monitored during session;Premedicated before session;Ice applied    Home Living Family/patient expects to be discharged to:: Private residence Living Arrangements: Spouse/significant other   Type of Home: House Home Access: Stairs to enter Entrance Stairs-Rails: None Entrance Stairs-Number of Steps: 2 Home Layout: Two level;Able to live on main level with bedroom/bathroom Home Equipment: Crutches      Prior Function Level of Independence: Independent               Hand Dominance        Extremity/Trunk Assessment               Lower Extremity Assessment: RLE deficits/detail RLE Deficits / Details: hip flex at least 2/5, hip abd/add at least 2/5, moves ankle well    Cervical / Trunk Assessment: Normal  Communication   Communication: No difficulties  Cognition Arousal/Alertness: Awake/alert Behavior During Therapy: WFL for tasks assessed/performed Overall Cognitive Status: Within Functional Limits for tasks assessed                      General Comments      Exercises        Assessment/Plan    PT Assessment Patient needs continued PT services  PT Diagnosis Difficulty walking;Acute pain   PT Problem List Decreased strength;Decreased range of motion;Decreased activity tolerance;Decreased balance;Decreased mobility;Decreased knowledge of use of DME;Pain  PT  Treatment Interventions DME instruction;Gait training;Stair training;Functional mobility training;Therapeutic activities;Therapeutic exercise;Patient/family education;Balance training   PT Goals (Current goals can be found in the Care Plan section) Acute Rehab PT Goals Patient Stated Goal: home tomorrow. less pain PT Goal Formulation: With patient Time For Goal Achievement: 05/10/14 Potential to Achieve Goals: Good    Frequency 7X/week   Barriers to discharge        Co-evaluation               End of Session Equipment Utilized During  Treatment: Gait belt Activity Tolerance: Patient limited by pain Patient left: in bed;with call bell/phone within reach           Time: 3086-5784 PT Time Calculation (min) (ACUTE ONLY): 15 min   Charges:   PT Evaluation $Initial PT Evaluation Tier I: 1 Procedure PT Treatments $Gait Training: 8-22 mins   PT G Codes:        Weston Anna, MPT Pager: [phone]

## 2014-05-03 NOTE — Anesthesia Procedure Notes (Signed)
Spinal Patient location during procedure: OR Start time: 05/03/2014 8:29 AM End time: 05/03/2014 8:35 AM Staffing Resident/CRNA: Harle Stanford R Performed by: resident/CRNA  Preanesthetic Checklist Completed: patient identified, site marked, surgical consent, pre-op evaluation, timeout performed, IV checked, risks and benefits discussed and monitors and equipment checked Spinal Block Patient position: sitting Prep: Betadine Patient monitoring: heart rate, cardiac monitor, continuous pulse ox and blood pressure Approach: right paramedian Location: L3-4 Injection technique: single-shot Needle Needle type: Spinocan  Needle gauge: 22 G Needle length: 9 cm Needle insertion depth: 6 cm Assessment Sensory level: T6

## 2014-05-03 NOTE — Op Note (Signed)
NAME:  Philip Stout                ACCOUNT NO.: [id number]      MEDICAL RECORD NO.: [id number]      FACILITY:  [facility]      PHYSICIAN:  Paralee Cancel D  DATE OF BIRTH:  [birth year]     DATE OF PROCEDURE:  05/03/2014                                 OPERATIVE REPORT         PREOPERATIVE DIAGNOSIS: Right  hip osteoarthritis.      POSTOPERATIVE DIAGNOSIS:  Right hip osteoarthritis.      PROCEDURE:  Right total hip replacement through an anterior approach   utilizing DePuy THR system, component size 9mm pinnacle cup, a size 36+4 neutral   Altrex liner, a size 4 Hi Tri Lock stem with a 36+1.5 delta ceramic   ball.      SURGEON:  Pietro Cassis. Alvan Dame, M.D.      ASSISTANT:  Nehemiah Massed, PA-C      ANESTHESIA:  Spinal.      SPECIMENS:  None.      COMPLICATIONS:  None.      BLOOD LOSS:  450 cc     DRAINS:  None.      INDICATION OF THE PROCEDURE:  Philip Stout is a 58 y.o. male who had   presented to office for evaluation of right hip pain.  Radiographs revealed   progressive degenerative changes with bone-on-bone   articulation to the  hip joint.  The patient had painful limited range of   motion significantly affecting their overall quality of life.  The patient was failing to    respond to conservative measures, and at this point was ready   to proceed with more definitive measures.  The patient has noted progressive   degenerative changes in his hip, progressive problems and dysfunction   with regarding the hip prior to surgery.  Consent was obtained for   benefit of pain relief.  Specific risk of infection, DVT, component   failure, dislocation, need for revision surgery, as well discussion of   the anterior versus posterior approach were reviewed.  Consent was   obtained for benefit of anterior pain relief through an anterior   approach.      PROCEDURE IN DETAIL:  The patient was brought to operative theater.   Once adequate anesthesia, preoperative  antibiotics, 2gm of Ancef administered.   The patient was positioned supine on the OSI Hanna table.  Once adequate   padding of boney process was carried out, we had predraped out the hip, and  used fluoroscopy to confirm orientation of the pelvis and position.      The right hip was then prepped and draped from proximal iliac crest to   mid thigh with shower curtain technique.      Time-out was performed identifying the patient, planned procedure, and   extremity.     An incision was then made 2 cm distal and lateral to the   anterior superior iliac spine extending over the orientation of the   tensor fascia lata muscle and sharp dissection was carried down to the   fascia of the muscle and protractor placed in the soft tissues.      The fascia was then incised.  The muscle belly was identified and  swept   laterally and retractor placed along the superior neck.  Following   cauterization of the circumflex vessels and removing some pericapsular   fat, a second cobra retractor was placed on the inferior neck.  A third   retractor was placed on the anterior acetabulum after elevating the   anterior rectus.  A L-capsulotomy was along the line of the   superior neck to the trochanteric fossa, then extended proximally and   distally.  Tag sutures were placed and the retractors were then placed   intracapsular.  We then identified the trochanteric fossa and   orientation of my neck cut, confirmed this radiographically   and then made a neck osteotomy with the femur on traction.  The femoral   head was removed without difficulty or complication.  Traction was let   off and retractors were placed posterior and anterior around the   acetabulum.      The labrum and foveal tissue were debrided.  I began reaming with a 55mm   reamer and reamed up to 27mm reamer with good bony bed preparation and a 55mm   cup was chosen.  The final 57mm Pinnacle cup was then impacted under fluoroscopy  to confirm  the depth of penetration and orientation with respect to   abduction.  A screw was placed followed by the hole eliminator.  The final   36+4 neutral Altrex liner was impacted with good visualized rim fit.  The cup was positioned anatomically within the acetabular portion of the pelvis.      At this point, the femur was rolled at 80 degrees.  Further capsule was   released off the inferior aspect of the femoral neck.  I then   released the superior capsule proximally.  The hook was placed laterally   along the femur and elevated manually and held in position with the bed   hook.  The leg was then extended and adducted with the leg rolled to 100   degrees of external rotation.  Once the proximal femur was fully   exposed, I used a box osteotome to set orientation.  I then began   broaching with the starting chili pepper broach and passed this by hand and then broached up to 4.  With the 4 broach in place I chose a high offset neck and did a trial reduction with the 36+1.5 head ball trial.  The offset was appropriate, leg lengths   appeared to be equal, confirmed radiographically.   Given these findings, I went ahead and dislocated the hip, repositioned all   retractors and positioned the right hip in the extended and abducted position.  The final 4 Hi Tri Lock stem was   chosen and it was impacted down to the level of neck cut.  Based on this   and the trial reduction, a 36+1.5 delta ceramic ball was chosen and   impacted onto a clean and dry trunnion, and the hip was reduced.  The   hip had been irrigated throughout the case again at this point.  I did   reapproximate the superior capsular leaflet to the anterior leaflet   using #1 Vicryl.  The fascia of the   tensor fascia lata muscle was then reapproximated using #1 Vicryl and #0 V-lock sutures.  The   remaining wound was closed with 2-0 Vicryl and running 4-0 Monocryl.   The hip was cleaned, dried, and dressed sterilely using Dermabond and    Aquacel dressing.  She was then brought   to recovery room in stable condition tolerating the procedure well.    Nehemiah Massed, PA-C was present for the entirety of the case involved from   preoperative positioning, perioperative retractor management, general   facilitation of the case, as well as primary wound closure as assistant.            Pietro Cassis Alvan Dame, M.D.        05/03/2014 10:02 AM

## 2014-05-03 NOTE — Interval H&P Note (Signed)
History and Physical Interval Note:  05/03/2014 6:52 AM  Philip Stout  has presented today for surgery, with the diagnosis of RIGHT HIP OA  The various methods of treatment have been discussed with the patient and family. After consideration of risks, benefits and other options for treatment, the patient has consented to  Procedure(s): RIGHT TOTAL HIP ARTHROPLASTY ANTERIOR APPROACH (Right) as a surgical intervention .  The patient's history has been reviewed, patient examined, no change in status, stable for surgery.  I have reviewed the patient's chart and labs.  Questions were answered to the patient's satisfaction.     Mauri Pole

## 2014-05-03 NOTE — Plan of Care (Signed)
Problem: Consults Goal: Diagnosis- Total Joint Replacement Right anterior hip     

## 2014-05-03 NOTE — Anesthesia Preprocedure Evaluation (Signed)
Anesthesia Evaluation  Patient identified by MRN, date of birth, ID band Patient awake    Reviewed: Allergy & Precautions, H&P , NPO status , Patient's Chart, lab work & pertinent test results  History of Anesthesia Complications (+) PONV  Airway Mallampati: II  TM Distance: >3 FB Neck ROM: Full    Dental no notable dental hx.    Pulmonary former smoker,  breath sounds clear to auscultation  Pulmonary exam normal       Cardiovascular hypertension, Pt. on medications Rhythm:Regular Rate:Normal  RBBB   Neuro/Psych negative neurological ROS  negative psych ROS   GI/Hepatic negative GI ROS, Neg liver ROS,   Endo/Other  negative endocrine ROS  Renal/GU negative Renal ROS  negative genitourinary   Musculoskeletal negative musculoskeletal ROS (+)   Abdominal   Peds negative pediatric ROS (+)  Hematology negative hematology ROS (+)   Anesthesia Other Findings   Reproductive/Obstetrics negative OB ROS                             Anesthesia Physical Anesthesia Plan  ASA: II  Anesthesia Plan: Spinal   Post-op Pain Management:    Induction:   Airway Management Planned: Simple Face Mask  Additional Equipment:   Intra-op Plan:   Post-operative Plan:   Informed Consent: I have reviewed the patients History and Physical, chart, labs and discussed the procedure including the risks, benefits and alternatives for the proposed anesthesia with the patient or authorized representative who has indicated his/her understanding and acceptance.   Dental advisory given  Plan Discussed with: CRNA  Anesthesia Plan Comments:         Anesthesia Quick Evaluation

## 2014-05-03 NOTE — Anesthesia Postprocedure Evaluation (Signed)
  Anesthesia Post-op Note  Patient: Philip Stout  Procedure(s) Performed: Procedure(s) (LRB): RIGHT TOTAL HIP ARTHROPLASTY ANTERIOR APPROACH (Right)  Patient Location: PACU  Anesthesia Type: Spinal  Level of Consciousness: awake and alert   Airway and Oxygen Therapy: Patient Spontanous Breathing  Post-op Pain: mild  Post-op Assessment: Post-op Vital signs reviewed, Patient's Cardiovascular Status Stable, Respiratory Function Stable, Patent Airway and No signs of Nausea or vomiting  Last Vitals:  Filed Vitals:   05/03/14 1445  BP: 110/73  Pulse: 83  Temp: 37.2 C  Resp: 18    Post-op Vital Signs: stable   Complications: No apparent anesthesia complications

## 2014-05-03 NOTE — Transfer of Care (Signed)
Immediate Anesthesia Transfer of Care Note  Patient: Philip Stout  Procedure(s) Performed: Procedure(s): RIGHT TOTAL HIP ARTHROPLASTY ANTERIOR APPROACH (Right)  Patient Location: PACU  Anesthesia Type:Spinal  Level of Consciousness: sedated  Airway & Oxygen Therapy: Patient Spontanous Breathing and Patient connected to face mask oxygen  Post-op Assessment: Report given to PACU RN and Post -op Vital signs reviewed and stable  Post vital signs: Reviewed and stable  Complications: No apparent anesthesia complications

## 2014-05-04 LAB — BASIC METABOLIC PANEL
Anion gap: 3 — ABNORMAL LOW (ref 5–15)
BUN: 21 mg/dL (ref 6–23)
CALCIUM: 8.5 mg/dL (ref 8.4–10.5)
CO2: 28 mmol/L (ref 19–32)
CREATININE: 1.03 mg/dL (ref 0.50–1.35)
Chloride: 104 mEq/L (ref 96–112)
GFR calc Af Amer: 90 mL/min — ABNORMAL LOW (ref >90)
GFR, EST NON AFRICAN AMERICAN: 78 mL/min — AB (ref >90)
GLUCOSE: 135 mg/dL — AB (ref 70–99)
Potassium: 4.2 mmol/L (ref 3.5–5.1)
SODIUM: 135 mmol/L (ref 135–145)

## 2014-05-04 LAB — CBC
HEMATOCRIT: 29.9 % — AB (ref 39.0–52.0)
HEMOGLOBIN: 10.2 g/dL — AB (ref 13.0–17.0)
MCH: 30.4 pg (ref 26.0–34.0)
MCHC: 34.1 g/dL (ref 30.0–36.0)
MCV: 89.3 fL (ref 78.0–100.0)
Platelets: 223 10*3/uL (ref 150–400)
RBC: 3.35 MIL/uL — AB (ref 4.22–5.81)
RDW: 12.3 % (ref 11.5–15.5)
WBC: 8.4 10*3/uL (ref 4.0–10.5)

## 2014-05-04 MED ORDER — PROMETHAZINE HCL 25 MG/ML IJ SOLN: 6.2500 mg | Freq: Four times a day (QID) | INTRAMUSCULAR | Status: DC | PRN

## 2014-05-04 MED ORDER — DSS 100 MG PO CAPS: 100.0000 mg | ORAL_CAPSULE | Freq: Two times a day (BID) | ORAL | Status: DC

## 2014-05-04 MED ORDER — PROMETHAZINE HCL 25 MG PO TABS: 12.5000 mg | ORAL_TABLET | Freq: Four times a day (QID) | ORAL | Status: DC | PRN

## 2014-05-04 MED ORDER — HYDROCODONE-ACETAMINOPHEN 7.5-325 MG PO TABS: 1.0000 | ORAL_TABLET | ORAL | Status: DC | PRN

## 2014-05-04 MED ORDER — ASPIRIN 325 MG PO TBEC: 325.0000 mg | DELAYED_RELEASE_TABLET | Freq: Two times a day (BID) | ORAL | Status: AC

## 2014-05-04 MED ORDER — FERROUS SULFATE 325 (65 FE) MG PO TABS: 325.0000 mg | ORAL_TABLET | Freq: Three times a day (TID) | ORAL | Status: DC

## 2014-05-04 MED ORDER — PROMETHAZINE HCL 12.5 MG PO TABS: 12.5000 mg | ORAL_TABLET | Freq: Four times a day (QID) | ORAL | Status: DC | PRN

## 2014-05-04 MED ORDER — POLYETHYLENE GLYCOL 3350 17 G PO PACK: 17.0000 g | PACK | Freq: Two times a day (BID) | ORAL | Status: DC

## 2014-05-04 MED ORDER — METHOCARBAMOL 500 MG PO TABS: 500.0000 mg | ORAL_TABLET | Freq: Four times a day (QID) | ORAL | Status: DC | PRN

## 2014-05-04 NOTE — Evaluation (Signed)
Occupational Therapy Evaluation Patient Details Name: Philip Stout MRN: [id number] DOB: 02/01/1956 Today's Date: 05/04/2014    History of Present Illness 58 yo male s/p R THA-direct anterior 05/02/14.    Clinical Impression   Pt limited by nausea this session but able to transfer into bathroom with walker and stand to void as well as practice on and off 3in1. Also practiced with AE. Pt declined feeling ready to do shower transfer due to nausea so demonstrated technique for pt and issued handout/reviewed. Will aim to see pt one more session to practice shower transfer as able.     Follow Up Recommendations  No OT follow up;Supervision/Assistance - 24 hour    Equipment Recommendations  3 in 1 bedside comode    Recommendations for Other Services       Precautions / Restrictions Precautions Precautions: None Restrictions Weight Bearing Restrictions: No RLE Weight Bearing: Weight bearing as tolerated      Mobility Bed Mobility Overal bed mobility: Needs Assistance Bed Mobility: Supine to Sit     Supine to sit: Min guard        Transfers Overall transfer level: Needs assistance Equipment used: Rolling walker (2 wheeled) Transfers: Sit to/from Stand Sit to Stand: Min guard         General transfer comment: verbal cues for safety, hand placement.    Balance                                            ADL Overall ADL's : Needs assistance/impaired Eating/Feeding: Independent;Sitting   Grooming: Wash/dry hands;Set up;Sitting   Upper Body Bathing: Set up;Sitting   Lower Body Bathing: Minimal assistance;Sit to/from stand   Upper Body Dressing : Set up;Sitting   Lower Body Dressing: Minimal assistance;Sit to/from stand   Toilet Transfer: Min guard;Ambulation;BSC;RW   Toileting- Clothing Manipulation and Hygiene: Min guard;Sit to/from stand         General ADL Comments: Pt states he already has the AE kit at home. Practiced with sock aid  to don socks at EOB and pt did well with supervision and one cue to pull sock on sock aid fully. Educated on how to use all AE pieces and pt verbalized understanding, stating he also tried out AE at home before surgery. Pt having nausea this am but able to tolerate into bathroom to void and sit/stand from 3in1. When asked about pain, pt states, "its ok." Nursing in room at end of session.      Vision                     Perception     Praxis      Pertinent Vitals/Pain Pain Assessment: No/denies pain (pt stating "its ok" several times during session) Pain Score: 5  Pain Location: R hip, thigh Pain Descriptors / Indicators: Burning;Sore Pain Intervention(s): Monitored during session;Ice applied     Hand Dominance     Extremity/Trunk Assessment Upper Extremity Assessment Upper Extremity Assessment: Overall WFL for tasks assessed           Communication Communication Communication: No difficulties   Cognition Arousal/Alertness: Awake/alert Behavior During Therapy: WFL for tasks assessed/performed Overall Cognitive Status: Within Functional Limits for tasks assessed                     General Comments         Exercises       Shoulder Instructions      Home Living Family/patient expects to be discharged to:: Private residence Living Arrangements: Spouse/significant other   Type of Home: House Home Access: Stairs to enter [employer] of Steps: 2 Entrance Stairs-Rails: None Home Layout: Two level;Able to live on main level with bedroom/bathroom Alternate Level Stairs-Number of Steps: 1 flight   Bathroom Shower/Tub: Occupational psychologist: Standard     Home Equipment: Crutches;Adaptive equipment Adaptive Equipment: Reacher;Sock aid;Long-handled shoe horn;Long-handled sponge        Prior Functioning/Environment Level of Independence: Independent             OT Diagnosis: Generalized weakness   OT Problem List:  Decreased strength;Decreased knowledge of use of DME or AE   OT Treatment/Interventions: Self-care/ADL training;Patient/family education;Therapeutic activities;DME and/or AE instruction    OT Goals(Current goals can be found in the care plan section) Acute Rehab OT Goals Patient Stated Goal: feel better OT Goal Formulation: With patient Time For Goal Achievement: 05/11/14 Potential to Achieve Goals: Good  OT Frequency: Min 2X/week   Barriers to D/C:            Co-evaluation              End of Session Equipment Utilized During Treatment: Rolling walker  Activity Tolerance: Other (comment) (limited by nausea) Patient left: in chair;with call bell/phone within reach   Time: 1000-1029 OT Time Calculation (min): 29 min Charges:  OT General Charges $OT Visit: 1 Procedure OT Evaluation $Initial OT Evaluation Tier I: 1 Procedure OT Treatments $Self Care/Home Management : 8-22 mins $Therapeutic Activity: 8-22 mins G-Codes:    Jules Schick  [phone] 05/04/2014, 10:48 AM

## 2014-05-04 NOTE — Care Management Note (Signed)
    Page 1 of 2   05/04/2014     1:28:18 PM CARE MANAGEMENT NOTE 05/04/2014  Patient:  Philip Stout,Philip Stout   Account Number:  [id number]  Date Initiated:  05/04/2014  Documentation initiated by:  [facility]  Subjective/Objective Assessment:   adm: RIGHT TOTAL HIP ARTHROPLASTY ANTERIOR APPROACH (Right     Action/Plan:   discharge planning   Anticipated DC Date:  05/04/2014   Anticipated DC Plan:  [place]  CM consult      [facility] Choice  HOME HEALTH   Choice offered to / List presented to:  C-1 Patient   DME arranged  3-N-1  Vassie Moselle      DME agency  [place] arranged  [place]   Status of service:  Completed, signed off Medicare Important Message given?   (If response is "NO", the following Medicare IM given date fields will be blank) Date Medicare IM given:   Medicare IM given by:   Date Additional Medicare IM given:   Additional Medicare IM given by:    Discharge Disposition:  [place]  Per UR Regulation:    If discussed at Long Length of Stay Meetings, dates discussed:    Comments:  05/04/14 07:45 CM met with pt in room to offer choice of home health agency.  Pt chooses [facility] to render HHPT. Address and contact information verified with pt and pt prefers his mobile.  CM called [facility] DME rep, Lecretia to please deliver a 3n1 and rolling walker to room prior to discharge.  Referral given to [facility] rep, Tim (on unit) and Octavia Bruckner is aware of mobile preference.  No other CM needs were communicated.  Mariane Masters, BSn, CM [phone].

## 2014-05-04 NOTE — Progress Notes (Signed)
Pt to d/c home with [place] home health. Needed DME delivered to room. Nausea resolved and pain under control. Patient feels ready to be d/c'd. AVS reviewed and "My Chart" discussed with pt. Pt capable of verbalizing medications, signs and symptoms of infection, and follow-up appointments. Remains hemodynamically stable. No signs and symptoms of distress. Educated pt to return to ER in the case of SOB, dizziness, or chest pain.

## 2014-05-04 NOTE — Progress Notes (Signed)
Utilization review completed.  

## 2014-05-04 NOTE — Progress Notes (Signed)
Physical Therapy Treatment Patient Details Name: Philip Stout MRN: [id number] DOB: [birth year] Today's Date: 05/04/2014    History of Present Illness 58 yo male s/p R THA-direct anterior 05/02/14.     PT Comments    Improved mobility this session and pt reports feeling better. Walked ~150 feet with walker. Practiced 2 steps with use of crutch and 1 HHA. Recommended to pt that he sleep in recliner tonight and practice flight of steps with HHPT. All education completed. Ready to d/c from PT standpoint.   Follow Up Recommendations  Home health PT     Equipment Recommendations  Rolling walker with 5" wheels    Recommendations for Other Services OT consult     Precautions / Restrictions Precautions Precautions: None Restrictions Weight Bearing Restrictions: No RLE Weight Bearing: Weight bearing as tolerated    Mobility  Bed Mobility Overal bed mobility: Needs Assistance Bed Mobility: Supine to Sit     Supine to sit: Min guard     General bed mobility comments: Pt OOB in recliner  Transfers Overall transfer level: Needs assistance Equipment used: Rolling walker (2 wheeled) Transfers: Sit to/from Stand Sit to Stand: Min guard         General transfer comment: verbal cues for safety, hand placement.  Ambulation/Gait Ambulation/Gait assistance: Min guard Ambulation Distance (Feet): 150 Feet Assistive device: Rolling walker (2 wheeled) Gait Pattern/deviations: Step-through pattern;Decreased stride length;Decreased step length - right;Trunk flexed     General Gait Details: Noted IR of R foot. slow gait speed. VCs safety, posture. close guard for safety.   Stairs Stairs: Yes Stairs assistance: Min assist Stair Management: Forwards;Step to pattern;With crutches Number of Stairs: 2 General stair comments: VCs safety, technique, sequence. Pt used crutch on 1 side, 1HHA on opposite side.   Wheelchair Mobility    Modified Rankin (Stroke Patients Only)        Balance                                    Cognition Arousal/Alertness: Awake/alert Behavior During Therapy: WFL for tasks assessed/performed Overall Cognitive Status: Within Functional Limits for tasks assessed                      Exercises Total Joint Exercises Ankle Circles/Pumps: AROM;Both;15 reps;Supine Quad Sets: AROM;Both;15 reps;Supine Heel Slides: AAROM;Right;15 reps;Supine Hip ABduction/ADduction: AAROM;Right;15 reps    General Comments        Pertinent Vitals/Pain Pain Assessment: No/denies pain Pain Score: 5  Pain Location: R hip, thigh Pain Descriptors / Indicators: Burning;Sore Pain Intervention(s): Ice applied    Home Living Family/patient expects to be discharged to:: Private residence Living Arrangements: Spouse/significant other   Type of Home: House Home Access: Stairs to enter Entrance Stairs-Rails: None Home Layout: Two level;Able to live on main level with bedroom/bathroom Home Equipment: Crutches;Adaptive equipment      Prior Function Level of Independence: Independent          PT Goals (current goals can now be found in the care plan section) Acute Rehab PT Goals Patient Stated Goal: feel better Progress towards PT goals: Progressing toward goals    Frequency  7X/week    PT Plan Current plan remains appropriate    Co-evaluation             End of Session   Activity Tolerance: Patient tolerated treatment well Patient left: with family/visitor present;with call bell/phone within  reach;in chair     Time: 1210-1230 PT Time Calculation (min) (ACUTE ONLY): 20 min  Charges:  $Gait Training: 8-22 mins $Therapeutic Exercise: 8-22 mins                    G Codes:      Weston Anna, MPT Pager: [phone]

## 2014-05-04 NOTE — Progress Notes (Signed)
     Subjective: 1 Day Post-Op Procedure(s) (LRB): RIGHT TOTAL HIP ARTHROPLASTY ANTERIOR APPROACH (Right)   Patient reports pain as mild, pain controlled. Complaining of some nausea, but otherwise no events.  Ready to be discharged home if he does well with PT, and nausea is better.  Objective:   VITALS:   Filed Vitals:   05/04/14  BP: 98/64  Pulse: 72  Temp: 97.4 F (36.3 C)   Resp: 16    Dorsiflexion/Plantar flexion intact Incision: dressing C/D/I No cellulitis present Compartment soft  LABS  Recent Labs  05/04/14 0510  HGB 10.2*  HCT 29.9*  WBC 8.4  PLT 223     Recent Labs  05/04/14 0510  NA 135  K 4.2  BUN 21  CREATININE 1.03  GLUCOSE 135*     Assessment/Plan: 1 Day Post-Op Procedure(s) (LRB): RIGHT TOTAL HIP ARTHROPLASTY ANTERIOR APPROACH (Right) Foley cath d/c'ed Advance diet Up with therapy D/C IV fluids Discharge home with home health  Follow up in 2 weeks at [facility]. Follow up with OLIN,Biviana Saddler D in 2 weeks.  Contact information:  [facility] [address], [address] [place]   PAC  05/04/2014, 9:56 AM

## 2014-05-04 NOTE — Progress Notes (Signed)
Physical Therapy Treatment Patient Details Name: Philip Stout MRN: [id number] DOB: 10/24/1955 Today's Date: 05/04/2014    History of Present Illness 58 yo male s/p R THA-direct anterior 05/02/14.     PT Comments    Pt declined OOB this session due to nausea. Pt was agreeable to exercises in bed. Will check back later to attempt to ambulate and practice steps.   Follow Up Recommendations  Home health PT     Equipment Recommendations  Rolling walker with 5" wheels    Recommendations for Other Services OT consult     Precautions / Restrictions Precautions Precautions: None Restrictions Weight Bearing Restrictions: No RLE Weight Bearing: Weight bearing as tolerated    Mobility  Bed Mobility                  Transfers                    Ambulation/Gait                 Stairs            Wheelchair Mobility    Modified Rankin (Stroke Patients Only)       Balance                                    Cognition Arousal/Alertness: Awake/alert Behavior During Therapy: WFL for tasks assessed/performed Overall Cognitive Status: Within Functional Limits for tasks assessed                      Exercises Total Joint Exercises Ankle Circles/Pumps: AROM;Both;15 reps;Supine Quad Sets: AROM;Both;15 reps;Supine Heel Slides: AAROM;Right;15 reps;Supine Hip ABduction/ADduction: AAROM;Right;15 reps    General Comments        Pertinent Vitals/Pain Pain Assessment: 0-10 Pain Score: 5  Pain Location: R hip, thigh Pain Descriptors / Indicators: Burning;Sore Pain Intervention(s): Monitored during session;Ice applied    Home Living                      Prior Function            PT Goals (current goals can now be found in the care plan section) Progress towards PT goals: Progressing toward goals    Frequency  7X/week    PT Plan Current plan remains appropriate    Co-evaluation             End of  Session   Activity Tolerance: Other (comment) (Limited by nausea-pt declined OOB this session) Patient left: in bed;with call bell/phone within reach     Time: 0847-0900 PT Time Calculation (min) (ACUTE ONLY): 13 min  Charges:  $Therapeutic Exercise: 8-22 mins                    G Codes:      Weston Anna, MPT Pager: [phone]

## 2014-05-05 ENCOUNTER — Encounter (HOSPITAL_COMMUNITY): Payer: Self-pay | Admitting: Orthopedic Surgery

## 2014-05-07 NOTE — Discharge Summary (Signed)
Physician Discharge Summary  Patient ID: Philip Stout MRN: [id number] DOB/AGE: [birth year] 59 y.o.  Admit date: 05/03/2014 Discharge date: 05/04/2014   Procedures:  Procedure(s) (LRB): RIGHT TOTAL HIP ARTHROPLASTY ANTERIOR APPROACH (Right)  Attending Physician:  Dr. Paralee Cancel   Admission Diagnoses:   Right hip OA / pain  Discharge Diagnoses:  Principal Problem:   S/P right THA, AA  Past Medical History  Diagnosis Date  . Complication of anesthesia   . PONV (postoperative nausea and vomiting)   . Hypertension   . Arthritis   . GERD (gastroesophageal reflux disease)   . Cancer     HX PROSTATE CANCER  . History of kidney stones   . Bipolar disorder   . Depression     HPI: Philip Stout, 59 y.o. male, has a history of pain and functional disability in the right hip(s) due to arthritis and patient has failed non-surgical conservative treatments for greater than 12 weeks to include NSAID's and/or analgesics, corticosteriod injections, use of assistive devices and activity modification. Onset of symptoms was gradual starting 2+ years ago with gradually worsening course since that time.The patient noted no past surgery on the right hip(s). Patient currently rates pain in the right hip at 9 out of 10 with activity. Patient has night pain, worsening of pain with activity and weight bearing, trendelenberg gait, pain that interfers with activities of daily living and pain with passive range of motion. Patient has evidence of periarticular osteophytes and joint space narrowing by imaging studies. This condition presents safety issues increasing the risk of falls. There is no current active infection. Risks, benefits and expectations were discussed with the patient. Risks including but not limited to the risk of anesthesia, blood clots, nerve damage, blood vessel damage, failure of the prosthesis, infection and up to and including death. Patient understand the risks, benefits and  expectations and wishes to proceed with surgery.   PCP: Vena Austria, MD   Discharged Condition: good  Hospital Course:  Patient underwent the above stated procedure on 05/03/2014. Patient tolerated the procedure well and brought to the recovery room in good condition and subsequently to the floor.  POD #1 BP: 98/64 ; Pulse: 72 ; Temp: 97.4 F (36.3 C) ; Resp: 16 Patient reports pain as mild, pain controlled. Complaining of some nausea, but otherwise no events. Ready to be discharged home if he does well with PT, and nausea is better. Dorsiflexion/plantar flexion intact, incision: dressing C/D/I, no cellulitis present and compartment soft.   LABS  Basename    HGB  10.2  HCT  29.9    Discharge Exam: General appearance: alert, cooperative and no distress Extremities: Homans sign is negative, no sign of DVT, no edema, redness or tenderness in the calves or thighs and no ulcers, gangrene or trophic changes  Disposition: Home with follow up in 2 weeks   Follow-up Information    Follow up with [facility]   Why:  home health physical therapy   Contact information:   [place] [address] [phone]       Follow up with [facility].   Why:  rolling walker and 3n1 (commode)   Contact information:   [place] [address] [phone]       Follow up with Mauri Pole, MD. Schedule an appointment as soon as possible for a visit in 2 weeks.   Specialty:  Orthopedic Surgery   Contact information:  [address] [place] [address] [phone]       Discharge Instructions    Call MD / Call 911    Complete by:  As directed   If you experience chest pain or shortness of breath, CALL 911 and be transported to the hospital emergency room.  If you develope a fever above 101 F, pus (white drainage) or increased drainage or redness at the wound, or calf pain, call your  surgeon's office.     Change dressing    Complete by:  As directed   Maintain surgical dressing until follow up in the clinic. If the edges start to pull up, may reinforce with tape. If the dressing is no longer working, may remove and cover with gauze and tape, but must keep the area dry and clean.  Call with any questions or concerns.     Constipation Prevention    Complete by:  As directed   Drink plenty of fluids.  Prune juice may be helpful.  You may use a stool softener, such as Colace (over the counter) 100 mg twice a day.  Use MiraLax (over the counter) for constipation as needed.     Diet - low sodium heart healthy    Complete by:  As directed      Discharge instructions    Complete by:  As directed   Maintain surgical dressing until follow up in the clinic. If the edges start to pull up, may reinforce with tape. If the dressing is no longer working, may remove and cover with gauze and tape, but must keep the area dry and clean.  Follow up in 2 weeks at [facility]. Call with any questions or concerns.     Increase activity slowly as tolerated    Complete by:  As directed      TED hose    Complete by:  As directed   Use stockings (TED hose) for 2 weeks on both leg(s).  You may remove them at night for sleeping.     Weight bearing as tolerated    Complete by:  As directed   Laterality:  left  Extremity:  Lower             Medication List    STOP taking these medications        traMADol 50 MG tablet  Commonly known as:  ULTRAM      TAKE these medications        ALPRAZolam 0.5 MG tablet  Commonly known as:  XANAX  Take 0.25-0.5 mg by mouth 2 (two) times daily as needed for anxiety.     aspirin 325 MG EC tablet  Take 1 tablet (325 mg total) by mouth 2 (two) times daily.     buPROPion 150 MG 24 hr tablet  Commonly known as:  WELLBUTRIN XL  Take 450 mg by mouth every morning.     DSS 100 MG Caps  Take 100 mg by mouth 2 (two) times daily.     ferrous  sulfate 325 (65 FE) MG tablet  Take 1 tablet (325 mg total) by mouth 3 (three) times daily after meals.     HYDROcodone-acetaminophen 7.5-325 MG per tablet  Commonly known as:  NORCO  Take 1-2 tablets by mouth every 4 (four) hours as needed for moderate pain.     lamoTRIgine 200 MG tablet  Commonly known as:  LAMICTAL  Take 400 mg by mouth at bedtime.     lisinopril-hydrochlorothiazide 20-12.5  MG per tablet  Commonly known as:  PRINZIDE,ZESTORETIC  Take 1 tablet by mouth every morning.     methocarbamol 500 MG tablet  Commonly known as:  ROBAXIN  Take 1 tablet (500 mg total) by mouth every 6 (six) hours as needed for muscle spasms.     omeprazole 20 MG capsule  Commonly known as:  PRILOSEC  Take 20 mg by mouth daily.     polyethylene glycol packet  Commonly known as:  MIRALAX / GLYCOLAX  Take 17 g by mouth 2 (two) times daily.     promethazine 12.5 MG tablet  Commonly known as:  PHENERGAN  Take 1 tablet (12.5 mg total) by mouth every 6 (six) hours as needed for nausea or vomiting.     risperiDONE 2 MG tablet  Commonly known as:  RISPERDAL  Take 2 mg by mouth at bedtime.         Signed: West Pugh. Merick Kelleher   PA-C  05/07/2014, 8:51 AM

## 2014-06-22 ENCOUNTER — Other Ambulatory Visit: Payer: Self-pay | Admitting: Dermatology

## 2019-01-29 ENCOUNTER — Other Ambulatory Visit: Payer: Self-pay

## 2019-02-11 NOTE — Progress Notes (Signed)
Cardiology Office Note:    Date:  02/12/2019   ID:  Philip Stout, DOB 07/18/1955, MRN [id number]  PCP:  Maury Dus, MD  Cardiologist:  No primary care provider on file.  Electrophysiologist:  None   Referring MD: Lois Huxley, PA   Chief Complaint  Patient presents with  . Pre-op Exam    History of Present Illness:    Philip Stout is a 63 y.o. male with a hx of hypertension, CKD stage III, hyperlipidemia, prostate cancer, GERD, bipolar disorder who is referred by Marilynne Drivers, PA for preop evaluation prior to left knee replacement.  Noted to have right bundle branch block on EKG. He reports that prior to his knee pain, which was about 5 years ago, he was very active.  Would bike and go on hikes.  Recently his activity has been limited due to his knee pain.  Does report that he could walk up 2 flights of stairs without stopping.  He denies any chest pain or dyspnea with exertion, but reports that activity limited by his knee issues.  He is on lisinopril-hydrochlorothiazide for his hypertension.  Reports that he has not been checking his blood pressure at home.  He smoked 1 pack/day for 3 to 4 years, quit 10 years ago.   Past Medical History:  Diagnosis Date  . Arthritis   . Bipolar disorder ([place])   . Cancer ([facility])    HX PROSTATE CANCER  . Complication of anesthesia   . Depression   . GERD (gastroesophageal reflux disease)   . History of kidney stones   . Hypertension   . PONV (postoperative nausea and vomiting)     Past Surgical History:  Procedure Laterality Date  . AXCHILLES TENDON     REPAIR  . KNEE ARTHROSCOPY     BILATERAL  . PROSTATECTOMY  2009  . ROTATOR CUFF REPAIR  1995   RT SHOULDER  . TOTAL HIP ARTHROPLASTY Right 05/03/2014   Procedure: RIGHT TOTAL HIP ARTHROPLASTY ANTERIOR APPROACH;  Surgeon: Mauri Pole, MD;  Location: [facility];  Service: Orthopedics;  Laterality: Right;    Current Medications: Current Meds  Medication Sig  . ALPRAZolam (XANAX) 0.5  MG tablet Take 0.25-0.5 mg by mouth 2 (two) times daily as needed for anxiety.  Marland Kitchen buPROPion (WELLBUTRIN XL) 150 MG 24 hr tablet Take 450 mg by mouth every morning.  . lamoTRIgine (LAMICTAL) 200 MG tablet Take 400 mg by mouth at bedtime.  Marland Kitchen lisinopril-hydrochlorothiazide (PRINZIDE,ZESTORETIC) 20-12.5 MG per tablet Take 1 tablet by mouth every morning.  Marland Kitchen omeprazole (PRILOSEC) 20 MG capsule Take 20 mg by mouth daily.  . risperiDONE (RISPERDAL) 2 MG tablet Take 2 mg by mouth at bedtime.     Allergies:   Patient has no known allergies.   Social History   Socioeconomic History  . Marital status: Married    Spouse name: Not on file  . Number of children: Not on file  . Years of education: Not on file  . Highest education level: Not on file  Occupational History  . Not on file  Social Needs  . Financial resource strain: Not on file  . Food insecurity    Worry: Not on file    Inability: Not on file  . Transportation needs    Medical: Not on file    Non-medical: Not on file  Tobacco Use  . Smoking status: Former Smoker    Quit date: 04/21/2006    Years since quitting: 12.8  .  Smokeless tobacco: Never Used  Substance and Sexual Activity  . Alcohol use: No  . Drug use: No  . Sexual activity: Not on file  Lifestyle  . Physical activity    Days per week: Not on file    Minutes per session: Not on file  . Stress: Not on file  Relationships  . Social Herbalist on phone: Not on file    Gets together: Not on file    Attends religious service: Not on file    Active member of club or organization: Not on file    Attends meetings of clubs or organizations: Not on file    Relationship status: Not on file  Other Topics Concern  . Not on file  Social History Narrative  . Not on file     Family History: Father CVA in 61s  ROS:   Please see the history of present illness.    All other systems reviewed and are negative.  EKGs/Labs/Other Studies Reviewed:    The  following studies were reviewed today:   EKG:  EKG is ordered today.  The ekg ordered today demonstrates normal sinus rhythm, right bundle branch block  Recent Labs: No results found for requested labs within last 8760 hours.  Recent Lipid Panel No results found for: CHOL, TRIG, HDL, CHOLHDL, VLDL, LDLCALC, LDLDIRECT  Physical Exam:    VS:  BP (!) 153/91   Pulse 80   Ht 6' (1.829 m)   Wt 191 lb 12.8 oz (87 kg)   SpO2 97%   BMI 26.01 kg/m     Wt Readings from Last 3 Encounters:  02/12/19 191 lb 12.8 oz (87 kg)  05/03/14 184 lb (83.5 kg)  04/21/14 184 lb (83.5 kg)     GEN: Well nourished, well developed in no acute distress HEENT: Normal NECK: No JVD; No carotid bruits LYMPHATICS: No lymphadenopathy CARDIAC: RRR, no murmurs, rubs, gallops RESPIRATORY:  Clear to auscultation without rales, wheezing or rhonchi  ABDOMEN: Soft, non-tender, non-distended MUSCULOSKELETAL:  No edema; No deformity  SKIN: Warm and dry NEUROLOGIC:  Alert and oriented x 3 PSYCHIATRIC:  Normal affect   ASSESSMENT:    1. Pre-op evaluation   2. RBBB   3. Essential hypertension   4. Hyperlipidemia, unspecified hyperlipidemia type    PLAN:    In order of problems listed above:  Preop evaluation: Prior to knee surgery.  No symptoms to suggest active cardiac condition.  Functional capacity is greater than 4 METS.  RCRI score is 0.  Overall would classify as low risk for an intermediate risk surgery.  Given right bundle branch block on EKG, will check TTE to evaluate for underlying structural heart disease.  If unremarkable, no additional cardiac testing recommended  RBBB: will check TTE as above  Hypertension: On lisinopril-hydrochlorothiazide 20-12.5 mg.  Appears elevated in clinic today, asked to check BP daily for next 2 weeks and call with results.  Hyperlipidemia: On Crestor 5 mg.  LDL 71 on 01/27/2019   Medication Adjustments/Labs and Tests Ordered: Current medicines are reviewed at length  with the patient today.  Concerns regarding medicines are outlined above.  Orders Placed This Encounter  Procedures  . EKG 12-Lead  . ECHOCARDIOGRAM COMPLETE   No orders of the defined types were placed in this encounter.   Patient Instructions  Medication Instructions:  Continue same medications   Lab work: None ordered   Testing/Procedures: Schedule Echo  Follow-Up: At [facility], you and your health needs  are our priority.  As part of our continuing mission to provide you with exceptional heart care, we have created designated Provider Care Teams.  These Care Teams include your primary Cardiologist (physician) and Advanced Practice Providers (APPs -  Physician Assistants and Nurse Practitioners) who all work together to provide you with the care you need, when you need it. . Follow up as needed      Signed, Donato Heinz, MD  02/12/2019 10:30 AM    [place]

## 2019-02-12 ENCOUNTER — Encounter: Payer: Self-pay | Admitting: Cardiology

## 2019-02-12 ENCOUNTER — Other Ambulatory Visit: Payer: Self-pay

## 2019-02-12 ENCOUNTER — Ambulatory Visit (INDEPENDENT_AMBULATORY_CARE_PROVIDER_SITE_OTHER): Payer: Managed Care, Other (non HMO) | Admitting: Cardiology

## 2019-02-12 VITALS — BP 153/91 | HR 80 | Ht 72.0 in | Wt 191.8 lb

## 2019-02-12 DIAGNOSIS — I4510 Unspecified right bundle-branch block: Secondary | ICD-10-CM | POA: Diagnosis not present

## 2019-02-12 DIAGNOSIS — Z01818 Encounter for other preprocedural examination: Secondary | ICD-10-CM

## 2019-02-12 DIAGNOSIS — I10 Essential (primary) hypertension: Secondary | ICD-10-CM

## 2019-02-12 DIAGNOSIS — E785 Hyperlipidemia, unspecified: Secondary | ICD-10-CM | POA: Diagnosis not present

## 2019-02-12 NOTE — Patient Instructions (Signed)
Medication Instructions:  Continue same medications   Lab work: None ordered   Testing/Procedures: Schedule Echo  Follow-Up: At [facility], you and your health needs are our priority.  As part of our continuing mission to provide you with exceptional heart care, we have created designated Provider Care Teams.  These Care Teams include your primary Cardiologist (physician) and Advanced Practice Providers (APPs -  Physician Assistants and Nurse Practitioners) who all work together to provide you with the care you need, when you need it. . Follow up as needed

## 2019-03-03 ENCOUNTER — Encounter (INDEPENDENT_AMBULATORY_CARE_PROVIDER_SITE_OTHER): Payer: Self-pay

## 2019-03-03 ENCOUNTER — Ambulatory Visit (HOSPITAL_COMMUNITY): Payer: Managed Care, Other (non HMO) | Attending: Cardiovascular Disease

## 2019-03-03 ENCOUNTER — Other Ambulatory Visit: Payer: Self-pay

## 2019-03-03 DIAGNOSIS — I4510 Unspecified right bundle-branch block: Secondary | ICD-10-CM | POA: Insufficient documentation

## 2019-03-05 ENCOUNTER — Other Ambulatory Visit: Payer: Self-pay

## 2019-03-05 DIAGNOSIS — I712 Thoracic aortic aneurysm, without rupture, unspecified: Secondary | ICD-10-CM

## 2019-03-05 DIAGNOSIS — Z01812 Encounter for preprocedural laboratory examination: Secondary | ICD-10-CM

## 2019-03-11 ENCOUNTER — Other Ambulatory Visit: Payer: Self-pay

## 2019-03-11 ENCOUNTER — Telehealth: Payer: Self-pay | Admitting: Cardiology

## 2019-03-11 DIAGNOSIS — Z01812 Encounter for preprocedural laboratory examination: Secondary | ICD-10-CM

## 2019-03-11 NOTE — Telephone Encounter (Signed)
New Message     Pt is calling to schedule his CT     Please call back

## 2019-03-11 NOTE — Telephone Encounter (Signed)
Message sent to scheduling to arrange. Left message to call back to let him know if he does not hear in 1 week to call back

## 2019-03-12 LAB — BASIC METABOLIC PANEL
BUN/Creatinine Ratio: 12 (ref 10–24)
BUN: 17 mg/dL (ref 8–27)
CO2: 22 mmol/L (ref 20–29)
Calcium: 9.6 mg/dL (ref 8.6–10.2)
Chloride: 99 mmol/L (ref 96–106)
Creatinine, Ser: 1.38 mg/dL — ABNORMAL HIGH (ref 0.76–1.27)
GFR calc Af Amer: 62 mL/min/{1.73_m2} (ref >59)
GFR calc non Af Amer: 54 mL/min/{1.73_m2} — ABNORMAL LOW (ref >59)
Glucose: 102 mg/dL — ABNORMAL HIGH (ref 65–99)
Potassium: 4.3 mmol/L (ref 3.5–5.2)
Sodium: 137 mmol/L (ref 134–144)

## 2019-03-29 ENCOUNTER — Other Ambulatory Visit: Payer: Self-pay

## 2019-03-29 ENCOUNTER — Ambulatory Visit (INDEPENDENT_AMBULATORY_CARE_PROVIDER_SITE_OTHER)
Admission: RE | Admit: 2019-03-29 | Discharge: 2019-03-29 | Disposition: A | Discharge status: Home / Self Care | Payer: Managed Care, Other (non HMO) | Source: Ambulatory Visit | Attending: Cardiology | Admitting: Cardiology

## 2019-03-29 DIAGNOSIS — I712 Thoracic aortic aneurysm, without rupture, unspecified: Secondary | ICD-10-CM

## 2019-03-29 MED ORDER — IOHEXOL 350 MG/ML SOLN
100.0000 mL | Freq: Once | INTRAVENOUS | Status: AC | PRN
  Administered 2019-03-29: 10:00:00 100 mL via INTRAVENOUS

## 2019-03-30 ENCOUNTER — Other Ambulatory Visit: Payer: Self-pay

## 2019-03-30 DIAGNOSIS — I712 Thoracic aortic aneurysm, without rupture, unspecified: Secondary | ICD-10-CM

## 2019-10-22 DIAGNOSIS — M1711 Unilateral primary osteoarthritis, right knee: Secondary | ICD-10-CM | POA: Diagnosis not present

## 2019-10-22 DIAGNOSIS — M25561 Pain in right knee: Secondary | ICD-10-CM | POA: Diagnosis not present

## 2019-12-02 DIAGNOSIS — H35373 Puckering of macula, bilateral: Secondary | ICD-10-CM | POA: Diagnosis not present

## 2019-12-02 DIAGNOSIS — H524 Presbyopia: Secondary | ICD-10-CM | POA: Diagnosis not present

## 2019-12-20 DIAGNOSIS — N521 Erectile dysfunction due to diseases classified elsewhere: Secondary | ICD-10-CM | POA: Diagnosis not present

## 2019-12-20 DIAGNOSIS — N528 Other male erectile dysfunction: Secondary | ICD-10-CM | POA: Diagnosis not present

## 2019-12-20 DIAGNOSIS — R6882 Decreased libido: Secondary | ICD-10-CM | POA: Diagnosis not present

## 2020-01-17 DIAGNOSIS — Z8546 Personal history of malignant neoplasm of prostate: Secondary | ICD-10-CM | POA: Diagnosis not present

## 2020-01-17 DIAGNOSIS — I10 Essential (primary) hypertension: Secondary | ICD-10-CM | POA: Diagnosis not present

## 2020-01-17 DIAGNOSIS — N521 Erectile dysfunction due to diseases classified elsewhere: Secondary | ICD-10-CM | POA: Diagnosis not present

## 2020-01-31 DIAGNOSIS — M25512 Pain in left shoulder: Secondary | ICD-10-CM | POA: Diagnosis not present

## 2020-01-31 DIAGNOSIS — M7542 Impingement syndrome of left shoulder: Secondary | ICD-10-CM | POA: Diagnosis not present

## 2020-03-07 DIAGNOSIS — M7542 Impingement syndrome of left shoulder: Secondary | ICD-10-CM | POA: Diagnosis not present

## 2020-03-07 DIAGNOSIS — R1904 Left lower quadrant abdominal swelling, mass and lump: Secondary | ICD-10-CM | POA: Diagnosis not present

## 2020-03-09 ENCOUNTER — Telehealth: Payer: Self-pay | Admitting: Cardiology

## 2020-03-09 NOTE — Telephone Encounter (Signed)
Spoke with patient regarding appointment for CTA chest/aorta scheduled Tuesday 03/28/20 at 3:30 pm at [facility]---arrival time is 3:00pm 1st floor admissions office for check in---liquids only 4 hours prior to study.  Patient voiced his understanding

## 2020-03-10 ENCOUNTER — Other Ambulatory Visit: Payer: Self-pay | Admitting: Physician Assistant

## 2020-03-10 DIAGNOSIS — R1904 Left lower quadrant abdominal swelling, mass and lump: Secondary | ICD-10-CM

## 2020-03-23 DIAGNOSIS — F3340 Major depressive disorder, recurrent, in remission, unspecified: Secondary | ICD-10-CM | POA: Diagnosis not present

## 2020-03-23 DIAGNOSIS — F411 Generalized anxiety disorder: Secondary | ICD-10-CM | POA: Diagnosis not present

## 2020-03-28 ENCOUNTER — Ambulatory Visit (HOSPITAL_COMMUNITY)
Admission: RE | Admit: 2020-03-28 | Discharge: 2020-03-28 | Disposition: A | Discharge status: Home / Self Care | Payer: Medicare Other | Source: Ambulatory Visit | Attending: Cardiology | Admitting: Cardiology

## 2020-03-28 ENCOUNTER — Other Ambulatory Visit: Payer: Self-pay

## 2020-03-28 DIAGNOSIS — K760 Fatty (change of) liver, not elsewhere classified: Secondary | ICD-10-CM | POA: Diagnosis not present

## 2020-03-28 DIAGNOSIS — I712 Thoracic aortic aneurysm, without rupture, unspecified: Secondary | ICD-10-CM

## 2020-03-28 MED ORDER — IOHEXOL 350 MG/ML SOLN
80.0000 mL | Freq: Once | INTRAVENOUS | Status: AC | PRN
  Administered 2020-03-28: 80 mL via INTRAVENOUS

## 2020-03-29 DIAGNOSIS — M25512 Pain in left shoulder: Secondary | ICD-10-CM | POA: Diagnosis not present

## 2020-03-29 DIAGNOSIS — M7542 Impingement syndrome of left shoulder: Secondary | ICD-10-CM | POA: Diagnosis not present

## 2020-04-03 DIAGNOSIS — Z1159 Encounter for screening for other viral diseases: Secondary | ICD-10-CM | POA: Diagnosis not present

## 2020-04-05 ENCOUNTER — Other Ambulatory Visit: Payer: Self-pay | Admitting: *Deleted

## 2020-04-05 DIAGNOSIS — I712 Thoracic aortic aneurysm, without rupture, unspecified: Secondary | ICD-10-CM

## 2020-04-05 DIAGNOSIS — K621 Rectal polyp: Secondary | ICD-10-CM | POA: Diagnosis not present

## 2020-04-05 DIAGNOSIS — Z1211 Encounter for screening for malignant neoplasm of colon: Secondary | ICD-10-CM | POA: Diagnosis not present

## 2020-04-05 DIAGNOSIS — D120 Benign neoplasm of cecum: Secondary | ICD-10-CM | POA: Diagnosis not present

## 2020-04-06 DIAGNOSIS — M25512 Pain in left shoulder: Secondary | ICD-10-CM | POA: Diagnosis not present

## 2020-04-07 DIAGNOSIS — K621 Rectal polyp: Secondary | ICD-10-CM | POA: Diagnosis not present

## 2020-04-07 DIAGNOSIS — D120 Benign neoplasm of cecum: Secondary | ICD-10-CM | POA: Diagnosis not present

## 2020-04-12 DIAGNOSIS — M25512 Pain in left shoulder: Secondary | ICD-10-CM | POA: Diagnosis not present

## 2020-04-13 DIAGNOSIS — M75102 Unspecified rotator cuff tear or rupture of left shoulder, not specified as traumatic: Secondary | ICD-10-CM | POA: Diagnosis not present

## 2020-05-01 DIAGNOSIS — M7542 Impingement syndrome of left shoulder: Secondary | ICD-10-CM | POA: Diagnosis not present

## 2020-05-01 DIAGNOSIS — M7522 Bicipital tendinitis, left shoulder: Secondary | ICD-10-CM | POA: Diagnosis not present

## 2020-05-01 DIAGNOSIS — S43432A Superior glenoid labrum lesion of left shoulder, initial encounter: Secondary | ICD-10-CM | POA: Diagnosis not present

## 2020-05-01 DIAGNOSIS — M24112 Other articular cartilage disorders, left shoulder: Secondary | ICD-10-CM | POA: Diagnosis not present

## 2020-05-01 DIAGNOSIS — M75122 Complete rotator cuff tear or rupture of left shoulder, not specified as traumatic: Secondary | ICD-10-CM | POA: Diagnosis not present

## 2020-05-01 DIAGNOSIS — M7552 Bursitis of left shoulder: Secondary | ICD-10-CM | POA: Diagnosis not present

## 2020-05-01 DIAGNOSIS — G8918 Other acute postprocedural pain: Secondary | ICD-10-CM | POA: Diagnosis not present

## 2020-05-15 DIAGNOSIS — M25512 Pain in left shoulder: Secondary | ICD-10-CM | POA: Diagnosis not present

## 2020-05-16 ENCOUNTER — Ambulatory Visit: Payer: Medicare Other | Admitting: Cardiology

## 2020-05-31 DIAGNOSIS — M25512 Pain in left shoulder: Secondary | ICD-10-CM | POA: Diagnosis not present

## 2020-06-06 ENCOUNTER — Ambulatory Visit: Payer: Medicare Other | Admitting: Cardiology

## 2020-06-13 DIAGNOSIS — M25512 Pain in left shoulder: Secondary | ICD-10-CM | POA: Diagnosis not present

## 2020-06-13 DIAGNOSIS — F331 Major depressive disorder, recurrent, moderate: Secondary | ICD-10-CM | POA: Diagnosis not present

## 2020-06-14 DIAGNOSIS — F331 Major depressive disorder, recurrent, moderate: Secondary | ICD-10-CM | POA: Diagnosis not present

## 2020-06-16 DIAGNOSIS — M25512 Pain in left shoulder: Secondary | ICD-10-CM | POA: Diagnosis not present

## 2020-06-19 DIAGNOSIS — M25512 Pain in left shoulder: Secondary | ICD-10-CM | POA: Diagnosis not present

## 2020-06-23 DIAGNOSIS — F3340 Major depressive disorder, recurrent, in remission, unspecified: Secondary | ICD-10-CM | POA: Diagnosis not present

## 2020-06-23 DIAGNOSIS — F411 Generalized anxiety disorder: Secondary | ICD-10-CM | POA: Diagnosis not present

## 2020-06-26 DIAGNOSIS — M25512 Pain in left shoulder: Secondary | ICD-10-CM | POA: Diagnosis not present

## 2020-06-28 DIAGNOSIS — M25512 Pain in left shoulder: Secondary | ICD-10-CM | POA: Diagnosis not present

## 2020-06-29 DIAGNOSIS — F411 Generalized anxiety disorder: Secondary | ICD-10-CM | POA: Diagnosis not present

## 2020-06-29 DIAGNOSIS — F331 Major depressive disorder, recurrent, moderate: Secondary | ICD-10-CM | POA: Diagnosis not present

## 2020-07-03 DIAGNOSIS — H524 Presbyopia: Secondary | ICD-10-CM | POA: Diagnosis not present

## 2020-07-03 DIAGNOSIS — M25512 Pain in left shoulder: Secondary | ICD-10-CM | POA: Diagnosis not present

## 2020-07-05 DIAGNOSIS — F411 Generalized anxiety disorder: Secondary | ICD-10-CM | POA: Diagnosis not present

## 2020-07-05 DIAGNOSIS — F331 Major depressive disorder, recurrent, moderate: Secondary | ICD-10-CM | POA: Diagnosis not present

## 2020-07-05 DIAGNOSIS — M25512 Pain in left shoulder: Secondary | ICD-10-CM | POA: Diagnosis not present

## 2020-07-10 DIAGNOSIS — M25512 Pain in left shoulder: Secondary | ICD-10-CM | POA: Diagnosis not present

## 2020-07-17 DIAGNOSIS — M25512 Pain in left shoulder: Secondary | ICD-10-CM | POA: Diagnosis not present

## 2020-07-20 DIAGNOSIS — F331 Major depressive disorder, recurrent, moderate: Secondary | ICD-10-CM | POA: Diagnosis not present

## 2020-07-24 DIAGNOSIS — M25512 Pain in left shoulder: Secondary | ICD-10-CM | POA: Diagnosis not present

## 2020-07-27 DIAGNOSIS — Z8546 Personal history of malignant neoplasm of prostate: Secondary | ICD-10-CM | POA: Diagnosis not present

## 2020-07-27 DIAGNOSIS — N393 Stress incontinence (female) (male): Secondary | ICD-10-CM | POA: Diagnosis not present

## 2020-07-27 DIAGNOSIS — N1831 Chronic kidney disease, stage 3a: Secondary | ICD-10-CM | POA: Diagnosis not present

## 2020-07-27 DIAGNOSIS — N183 Chronic kidney disease, stage 3 unspecified: Secondary | ICD-10-CM | POA: Diagnosis not present

## 2020-07-27 DIAGNOSIS — N5231 Erectile dysfunction following radical prostatectomy: Secondary | ICD-10-CM | POA: Diagnosis not present

## 2020-09-20 ENCOUNTER — Ambulatory Visit
Admission: RE | Admit: 2020-09-20 | Discharge: 2020-09-20 | Disposition: A | Discharge status: Home / Self Care | Payer: Medicare Other | Source: Ambulatory Visit | Attending: Physician Assistant | Admitting: Physician Assistant

## 2020-09-20 DIAGNOSIS — R1904 Left lower quadrant abdominal swelling, mass and lump: Secondary | ICD-10-CM

## 2020-09-20 DIAGNOSIS — R1909 Other intra-abdominal and pelvic swelling, mass and lump: Secondary | ICD-10-CM | POA: Diagnosis not present

## 2020-09-26 DIAGNOSIS — M7542 Impingement syndrome of left shoulder: Secondary | ICD-10-CM | POA: Diagnosis not present

## 2020-09-27 DIAGNOSIS — F331 Major depressive disorder, recurrent, moderate: Secondary | ICD-10-CM | POA: Diagnosis not present

## 2020-10-04 DIAGNOSIS — E7800 Pure hypercholesterolemia, unspecified: Secondary | ICD-10-CM | POA: Diagnosis not present

## 2020-10-04 DIAGNOSIS — Z0000 Encounter for general adult medical examination without abnormal findings: Secondary | ICD-10-CM | POA: Diagnosis not present

## 2020-10-04 DIAGNOSIS — I129 Hypertensive chronic kidney disease with stage 1 through stage 4 chronic kidney disease, or unspecified chronic kidney disease: Secondary | ICD-10-CM | POA: Diagnosis not present

## 2020-10-04 DIAGNOSIS — Z8546 Personal history of malignant neoplasm of prostate: Secondary | ICD-10-CM | POA: Diagnosis not present

## 2020-10-04 DIAGNOSIS — K219 Gastro-esophageal reflux disease without esophagitis: Secondary | ICD-10-CM | POA: Diagnosis not present

## 2020-10-05 ENCOUNTER — Other Ambulatory Visit: Payer: Self-pay | Admitting: Family Medicine

## 2020-10-05 DIAGNOSIS — Z136 Encounter for screening for cardiovascular disorders: Secondary | ICD-10-CM

## 2020-10-26 ENCOUNTER — Other Ambulatory Visit: Payer: Self-pay

## 2020-10-26 ENCOUNTER — Ambulatory Visit
Admission: RE | Admit: 2020-10-26 | Discharge: 2020-10-26 | Disposition: A | Discharge status: Home / Self Care | Payer: Medicare Other | Source: Ambulatory Visit | Attending: Family Medicine | Admitting: Family Medicine

## 2020-10-26 DIAGNOSIS — Z136 Encounter for screening for cardiovascular disorders: Secondary | ICD-10-CM

## 2020-10-26 DIAGNOSIS — F331 Major depressive disorder, recurrent, moderate: Secondary | ICD-10-CM | POA: Diagnosis not present

## 2020-10-26 DIAGNOSIS — I714 Abdominal aortic aneurysm, without rupture: Secondary | ICD-10-CM | POA: Diagnosis not present

## 2020-11-09 ENCOUNTER — Other Ambulatory Visit: Payer: Self-pay | Admitting: Family Medicine

## 2020-11-09 DIAGNOSIS — I779 Disorder of arteries and arterioles, unspecified: Secondary | ICD-10-CM

## 2020-11-21 ENCOUNTER — Other Ambulatory Visit: Payer: Self-pay | Admitting: Surgery

## 2020-11-21 DIAGNOSIS — K4090 Unilateral inguinal hernia, without obstruction or gangrene, not specified as recurrent: Secondary | ICD-10-CM | POA: Diagnosis not present

## 2020-11-29 ENCOUNTER — Other Ambulatory Visit: Payer: Medicare Other

## 2020-11-30 ENCOUNTER — Other Ambulatory Visit: Payer: Self-pay | Admitting: Family Medicine

## 2020-11-30 DIAGNOSIS — I714 Abdominal aortic aneurysm, without rupture, unspecified: Secondary | ICD-10-CM

## 2020-12-04 ENCOUNTER — Ambulatory Visit
Admission: RE | Admit: 2020-12-04 | Discharge: 2020-12-04 | Disposition: A | Discharge status: Home / Self Care | Payer: Medicare Other | Source: Ambulatory Visit | Attending: Family Medicine | Admitting: Family Medicine

## 2020-12-04 ENCOUNTER — Other Ambulatory Visit: Payer: Self-pay

## 2020-12-04 DIAGNOSIS — I714 Abdominal aortic aneurysm, without rupture, unspecified: Secondary | ICD-10-CM

## 2020-12-04 DIAGNOSIS — Z8546 Personal history of malignant neoplasm of prostate: Secondary | ICD-10-CM | POA: Diagnosis not present

## 2020-12-04 DIAGNOSIS — K4090 Unilateral inguinal hernia, without obstruction or gangrene, not specified as recurrent: Secondary | ICD-10-CM | POA: Diagnosis not present

## 2020-12-04 DIAGNOSIS — D3501 Benign neoplasm of right adrenal gland: Secondary | ICD-10-CM | POA: Diagnosis not present

## 2020-12-04 DIAGNOSIS — M47816 Spondylosis without myelopathy or radiculopathy, lumbar region: Secondary | ICD-10-CM | POA: Diagnosis not present

## 2020-12-04 MED ORDER — IOPAMIDOL (ISOVUE-370) INJECTION 76%
75.0000 mL | Freq: Once | INTRAVENOUS | Status: AC | PRN
  Administered 2020-12-04: 75 mL via INTRAVENOUS

## 2020-12-07 DIAGNOSIS — F331 Major depressive disorder, recurrent, moderate: Secondary | ICD-10-CM | POA: Diagnosis not present

## 2020-12-08 DIAGNOSIS — F3340 Major depressive disorder, recurrent, in remission, unspecified: Secondary | ICD-10-CM | POA: Diagnosis not present

## 2020-12-08 DIAGNOSIS — F411 Generalized anxiety disorder: Secondary | ICD-10-CM | POA: Diagnosis not present

## 2020-12-11 NOTE — Progress Notes (Signed)
Surgical Instructions    Your procedure is scheduled on 12/21/20.  Report to [facility] Entr[facility] "A" at 12:00 PM., then check in with the Admitting office.  Call this number if you have problems the morning of surgery:  [phone]   If you have any questions prior to your surgery date call [phone]: Open Monday-Friday 8am-4pm    Remember:  Do not eat after midnight the night before your surgery  You may drink clear liquids until 12:00 the afternoon of your surgery.   Clear liquids allowed are: Water, Non-Citrus Juices (without pulp), Carbonated Beverages, Clear Tea, Black Coffee Only, and Gatorade  Please complete your PRE-SURGERY ENSURE that was provided to you by 12:00 PM the day of surgery.  Please, if able, drink it in one setting. DO NOT SIP.     Take these medicines the morning of surgery with A SIP OF WATER: buPROPion (WELLBUTRIN XL)  omeprazole (PRILOSEC)  ALPRAZolam (XANAX) - if needed    As of today, STOP taking any Aspirin (unless otherwise instructed by your surgeon) Aleve, Naproxen, Ibuprofen, Motrin, Advil, Goody's, BC's, all herbal medications, fish oil, and all vitamins.          Do not wear jewelry  Do not wear lotions, powders, colognes, or deodorant. Do not shave 48 hours prior to surgery.  Men may shave face and neck. Do not bring valuables to the hospital.              [facility] is not responsible for any belongings or valuables.  Do NOT Smoke (Tobacco/Vaping) or drink Alcohol 24 hours prior to your procedure If you use a CPAP at night, you may bring all equipment for your overnight stay.   Contacts, glasses, dentures or bridgework may not be worn into surgery, please bring cases for these belongings   For patients admitted to the hospital, discharge time will be determined by your treatment team.   Patients discharged the day of surgery will not be allowed to drive home, and someone needs to stay with them for 24 hours.  ONLY 1 SUPPORT  PERSON MAY BE PRESENT WHILE YOU ARE IN SURGERY. IF YOU ARE TO BE ADMITTED ONCE YOU ARE IN YOUR ROOM YOU WILL BE ALLOWED TWO (2) VISITORS.  Minor children may have two parents present. Special consideration for safety and communication needs will be reviewed on a case by case basis.  Special instructions:    Oral Hygiene is also important to reduce your risk of infection.  Remember - BRUSH YOUR TEETH THE MORNING OF SURGERY WITH YOUR REGULAR TOOTHPASTE   [place]- Preparing For Surgery  Before surgery, you can play an important role. Because skin is not sterile, your skin needs to be as free of germs as possible. You can reduce the number of germs on your skin by washing with CHG (chlorahexidine gluconate) Soap before surgery.  CHG is an antiseptic cleaner which kills germs and bonds with the skin to continue killing germs even after washing.     Please do not use if you have an allergy to CHG or antibacterial soaps. If your skin becomes reddened/irritated stop using the CHG.  Do not shave (including legs and underarms) for at least 48 hours prior to first CHG shower. It is OK to shave your face.  Please follow these instructions carefully.     Shower the NIGHT BEFORE SURGERY and the MORNING OF SURGERY with CHG Soap.   If you chose to wash your hair, wash  your hair first as usual with your normal shampoo. After you shampoo, rinse your hair and body thoroughly to remove the shampoo.  Then [employer] and genitals (private parts) with your normal soap and rinse thoroughly to remove soap.  After that Use CHG Soap as you would any other liquid soap. You can apply CHG directly to the skin and wash gently with a scrungie or a clean washcloth.   Apply the CHG Soap to your body ONLY FROM THE NECK DOWN.  Do not use on open wounds or open sores. Avoid contact with your eyes, ears, mouth and genitals (private parts). Wash Face and genitals (private parts)  with your normal soap.   Wash thoroughly,  paying special attention to the area where your surgery will be performed.  Thoroughly rinse your body with warm water from the neck down.  DO NOT shower/wash with your normal soap after using and rinsing off the CHG Soap.  Pat yourself dry with a CLEAN TOWEL.  Wear CLEAN PAJAMAS to bed the night before surgery  Place CLEAN SHEETS on your bed the night before your surgery  DO NOT SLEEP WITH PETS.   Day of Surgery:  Take a shower with CHG soap. Wear Clean/Comfortable clothing the morning of surgery Do not apply any deodorants/lotions.   Remember to brush your teeth WITH YOUR REGULAR TOOTHPASTE.   Please read over the following fact sheets that you were given.

## 2020-12-12 ENCOUNTER — Encounter (HOSPITAL_COMMUNITY)
Admission: RE | Admit: 2020-12-12 | Discharge: 2020-12-12 | Disposition: A | Discharge status: Home / Self Care | Payer: Medicare Other | Source: Ambulatory Visit | Attending: Surgery | Admitting: Surgery

## 2020-12-12 ENCOUNTER — Other Ambulatory Visit: Payer: Self-pay

## 2020-12-12 ENCOUNTER — Encounter (HOSPITAL_COMMUNITY): Payer: Self-pay

## 2020-12-12 DIAGNOSIS — Z01818 Encounter for other preprocedural examination: Secondary | ICD-10-CM | POA: Diagnosis not present

## 2020-12-12 HISTORY — DX: Aneurysm of the ascending aorta, without rupture: I71.21

## 2020-12-12 HISTORY — DX: Thoracic aortic aneurysm, without rupture: I71.2

## 2020-12-12 HISTORY — DX: Cardiac arrhythmia, unspecified: I49.9

## 2020-12-12 HISTORY — DX: Unspecified right bundle-branch block: I45.10

## 2020-12-12 LAB — CBC
HCT: 40.1 % (ref 39.0–52.0)
Hemoglobin: 12.9 g/dL — ABNORMAL LOW (ref 13.0–17.0)
MCH: 29.1 pg (ref 26.0–34.0)
MCHC: 32.2 g/dL (ref 30.0–36.0)
MCV: 90.3 fL (ref 80.0–100.0)
Platelets: 241 10*3/uL (ref 150–400)
RBC: 4.44 MIL/uL (ref 4.22–5.81)
RDW: 13.2 % (ref 11.5–15.5)
WBC: 6.7 10*3/uL (ref 4.0–10.5)
nRBC: 0 % (ref 0.0–0.2)

## 2020-12-12 LAB — BASIC METABOLIC PANEL
Anion gap: 10 (ref 5–15)
BUN: 14 mg/dL (ref 8–23)
CO2: 24 mmol/L (ref 22–32)
Calcium: 8.9 mg/dL (ref 8.9–10.3)
Chloride: 101 mmol/L (ref 98–111)
Creatinine, Ser: 1.44 mg/dL — ABNORMAL HIGH (ref 0.61–1.24)
GFR, Estimated: 54 mL/min — ABNORMAL LOW (ref >60)
Glucose, Bld: 104 mg/dL — ABNORMAL HIGH (ref 70–99)
Potassium: 4.2 mmol/L (ref 3.5–5.1)
Sodium: 135 mmol/L (ref 135–145)

## 2020-12-12 NOTE — Progress Notes (Signed)
PCP: Maury Dus, MD Cardiologist: Oswaldo Milian, MD  EKG: 12/12/20 CXR: 04/11/14 ECHO: 03/03/19 Stress Test: Denies Cardiac Cath: Denies  Fasting Blood Sugar- na Checks Blood Sugar__na_ times a day  OSA/CPAP: NO  ASA/Blood Thinner: No  Covid test not needed  Anesthesia Review: Yes, abnormal EKG.  Needed cardiac clearance for surgery 04/05/19 for RBBB.  Patient denies shortness of breath, fever, cough, and chest pain at PAT appointment.  Patient verbalized understanding of instructions provided today at the PAT appointment.  Patient asked to review instructions at home and day of surgery.

## 2020-12-13 ENCOUNTER — Encounter (HOSPITAL_COMMUNITY): Payer: Self-pay

## 2020-12-13 NOTE — Progress Notes (Signed)
Anesthesia Chart Review:  Case: [id number] Date/Time: 12/21/20 1345   Procedure: OPEN LEFT INGUINAL HERNIA REPAIR WITH MESH (Left)   Anesthesia type: General   Pre-op diagnosis: LEFT INGUINAL HERNIA   Location: [facility] OR ROOM 02 / [facility]   Surgeons: Coralie Keens, MD      Special needs: E RASS, supine, mesh, LMA, tTAP block  DISCUSSION: Patient is a 65 year old male scheduled for the above procedure.  History includes smoking ("some days" cigerettes/cigars), post-operative N/V, HTN, GERD, prostate cancer (s/p robotic assisted laparoscopic radical retropubic prostatectomy 01/11/2010), Bipolar disorder, RBBB, ascending thoracic aortic aneurysm (TAA), THA (right 05/03/14). No reported CKD, but his last labs scine show a Creatinine 1.38 03/11/19, 1.24 07/27/20, and 1.44 on 12/12/20.   He was evaluated by cardiologist Dr. Gardiner Rhyme on 02/12/19 for preoperative evaluation for TKA after EKG showed RBBB. Patient asymptomatic. Echo ordered which was considered normal except for mildly dilated aorta at 4.2 cm which was confirmed on CTA. Last Chest CTA for TAA follow-up showed the TAA largest at the level of the sinus of Valsalva and measuring 4.5 cm. Overall measurements felt stable, and one year follow-up imaging recommended. His PCP also ordered a CTA of the abd/pelvis 12/04/20 which did not show a AAA but a 3 month abdominal MRI recommended to fully characterize multiple subcentimeter hyperenhancing foci within the spleen.  12/12/20 EKG shows stable NSR, RBBB. He denied SOB, chest pain, cough, fever at PAT RN visit. Dr. Trevor Mace consult note also states, "He bikes regularly and has no cardiopulmonary issues."  Anesthesia team to evaluate on the day of surgery.   VS: BP 130/84   Pulse 70   Temp 36.7 C (Oral)   Resp 17   Ht '5\' 11"'$  (1.803 m)   Wt 83 kg   SpO2 100%   BMI 25.51 kg/m    PROVIDERS: Maury Dus, MD is PCP  Oswaldo Milian, MD is cardiologist Lenoria Chime, MD is urologist  ([facility]). He is scheduled for penile prosthesis surgery 02/05/21.    LABS: Labs reviewed: Acceptable for surgery. See DISCUSSION. (all labs ordered are listed, but only abnormal results are displayed)  Labs Reviewed  CBC - Abnormal; Notable for the following components:      Result Value   Hemoglobin 12.9 (*)    All other components within normal limits  BASIC METABOLIC PANEL - Abnormal; Notable for the following components:   Glucose, Bld 104 (*)    Creatinine, Ser 1.44 (*)    GFR, Estimated 54 (*)    All other components within normal limits     IMAGES: CTA Abd/pelvis 12/04/20: IMPRESSION: VASCULAR No abdominal aortic aneurysm. NON-VASCULAR 1. Bilateral adrenal adenomas. 2. Multiple subcentimeter hyperenhancing foci within the spleen seen only on the arterial phase are too small to fully characterize. Follow-up contrast enhanced abdominal MRI should be performed in 3 months to document stability and further evaluate these findings.    US Pelvis (limited) 09/20/20: IMPRESSION: 1. Positive exam for a left inguinal hernia, bowel and peritoneal fat extending into the inguinal canal with Valsalva.    CTA Chest 03/28/20: - Thoracic aorta is well visualized measuring 4.1 cm at the level of the main pulmonary artery stable in appearance from the prior study. The aorta measures 4.5 cm at the level of the sinus of Valsalva. The sino-tubular junction measures approximately 3.5 cm. No evidence of dissection is noted. 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 Descending thoracic aorta shows no dissection. Atherosclerotic calcifications are noted. The pulmonary artery shows a normal branching  pattern without evidence of filling defect to suggest pulmonary embolism. No significant coronary calcifications are noted. IMPRESSION: - Stable appearance of the ascending aortic aneurysm. No evidence of dissection is seen. Recommend annual imaging followup by CTA or MRA. This recommendation follows  2010 ACCF/AHA/AATS/ACR/ASA/SCA/SCAI/SIR/STS/SVM Guidelines for the Diagnosis and Management of Patients with Thoracic Aortic Disease. Circulation. 2010; [id number]. Aortic aneurysm NOS (ICD10-I71.9) - Stable appearance of fatty liver and adrenal hypertrophy. - No acute abnormality noted. - Aortic Atherosclerosis (ICD10-I70.0).   EKG: 12/12/20: Normal sinus rhythm Right bundle branch block Abnormal ECG No significant change since last tracing Confirmed by [place], Peter [phone]) on 12/12/2020 4:58:33 PM   CV: Echo 03/03/19: IMPRESSIONS   1. Left ventricular ejection fraction, by visual estimation, is 55 to  60%. The left ventricle has normal function. Normal left ventricular size.  There is borderline left ventricular hypertrophy.   2. LVEF by 3D assessment is 58%.   3. The aortic root is mildly dilated up to 4.2 cm (2.0 cm/m2). The Asc Ao  is mildly dilated as well up to 4.2 cm (2.0 cm/m2). Both values are closer  to boderline enlarged when considering age/sex.   4. Global right ventricle has normal systolic function.The right  ventricular size is normal. No increase in right ventricular wall  thickness.   5. Left atrial size was normal.   6. Right atrial size was normal.   7. The pericardial effusion is posterior to the left ventricle.   8. Trivial pericardial effusion is present.   9. The mitral valve is grossly normal. No evidence of mitral valve  regurgitation.  10. The tricuspid valve is grossly normal. Tricuspid valve regurgitation  is not demonstrated.  11. The aortic valve is tricuspid. Aortic valve regurgitation is mild. No  evidence of aortic valve sclerosis or stenosis.  12. The pulmonic valve was grossly normal. Pulmonic valve regurgitation is  not visualized.  13. Aortic dilatation noted.  14. There is mild dilatation of the aortic root measuring 42 mm.  15. Normal pulmonary artery systolic pressure.  16. The tricuspid regurgitant velocity is 1.55 m/s, and with  an assumed  right atrial pressure of 3 mmHg, the estimated right ventricular systolic  pressure is normal at 12.6 mmHg.  17. The inferior vena cava is normal in size with greater than 50%  respiratory variability, suggesting right atrial pressure of 3 mmHg.  - In comparison to the previous echocardiogram(s): There are no prior  studies on this patient for comparison purposes.    Past Medical History:  Diagnosis Date   Arthritis    Bipolar disorder ([place])    Cancer ([place])    HX PROSTATE CANCER   Complication of anesthesia    Depression    Dysrhythmia    GERD (gastroesophageal reflux disease)    History of kidney stones    Hypertension    PONV (postoperative nausea and vomiting)    Right bundle branch block (RBBB)    Thoracic ascending aortic aneurysm ([place])     Past Surgical History:  Procedure Laterality Date   AXCHILLES TENDON     REPAIR   KNEE ARTHROSCOPY     BILATERAL   PROSTATECTOMY  2009   [place]   RT SHOULDER   TOTAL HIP ARTHROPLASTY Right 05/03/2014   Procedure: RIGHT TOTAL HIP ARTHROPLASTY ANTERIOR APPROACH;  Surgeon: Mauri Pole, MD;  Location: [facility];  Service: Orthopedics;  Laterality: Right;    MEDICATIONS:  ALPRAZolam (XANAX) 0.5 MG tablet  atorvastatin (LIPITOR) 10 MG tablet   buPROPion (WELLBUTRIN XL) 300 MG 24 hr tablet   ibuprofen (ADVIL) 200 MG tablet   lamoTRIgine (LAMICTAL) 200 MG tablet   lisinopril-hydrochlorothiazide (PRINZIDE,ZESTORETIC) 20-12.5 MG per tablet   omeprazole (PRILOSEC) 20 MG capsule   risperiDONE (RISPERDAL) 0.5 MG tablet   No current facility-administered medications for this encounter.    Myra Gianotti, PA-C Surgical Short Stay/Anesthesiology [facility] Phone [phone] [facility] Phone [phone] 12/13/2020 6:22 PM

## 2020-12-20 NOTE — H&P (Signed)
PROVIDER: Beverlee Nims, MD  MRN: [id number] DOB: July 23, 1955 DATE OF ENCOUNTER: 11/21/2020  Subjective   Chief Complaint: Hernia   History of Present Illness: [facility] is a 65 y.o. male who is seen today as an office consultation at the request of Dr. Alyson Ingles for evaluation of Hernia .   This is a pleasant gentleman referred here for evaluation of a left inguinal hernia. He has had a hernia for over a year. It rarely reduces but causes minimal discomfort until recently. He had an ultrasound confirming that this was a hernia. He has had no issues regarding his right inguinal area. He has had previous prostate surgery and multiple other surgical procedures without anesthesia problems. He bikes regularly and has no cardiopulmonary issues. He reports the discomfort in the groin is mild. He has had no obstructive symptoms.  Review of Systems: A complete review of systems was obtained from the patient. I have reviewed this information and discussed as appropriate with the patient. See HPI as well for other ROS.  ROS   Medical History: History reviewed. No pertinent past medical history. History of prostate cancer status post prostatectomy  There is no problem list on file for this patient.  History reviewed. No pertinent surgical history. Prostatectomy, shoulder surgery  No Known Allergies  Current Outpatient Medications on File Prior to Visit  Medication Sig Dispense Refill   omeprazole (PRILOSEC) 20 MG DR capsule omeprazole 20 mg capsule,delayed release TAKE 1 CAPSULE BY MOUTH EVERY MORNING   tadalafiL (CIALIS) 20 MG tablet Take by mouth nightly as needed   ALPRAZolam (XANAX) 0.5 MG tablet alprazolam 0.5 mg tablet TAKE 1 TABLET BY MOUTH EVERY DAY. GENERIC EQUIVALENT FOR XANAX   atorvastatin (LIPITOR) 10 MG tablet Take 10 mg by mouth once a week   buPROPion (WELLBUTRIN XL) 150 MG XL tablet Take by mouth   famotidine (PEPCID) 40 MG tablet Take 40 mg by mouth nightly    lamoTRIgine (LAMICTAL) 200 MG tablet lamotrigine 200 mg tablet   lisinopriL-hydrochlorothiazide (ZESTORETIC) 20-12.5 mg tablet lisinopril 20 mg-hydrochlorothiazide 12.5 mg tablet TAKE 1 TABLET BY MOUTH ONCE A DAY   methocarbamoL (ROBAXIN) 500 MG tablet methocarbamol 500 mg tablet TAKE 1 TABLET BY MOUTH EVERY 8 HOURS AS NEEDED FOR PAIN/ MUSCLE SPASM   ondansetron (ZOFRAN-ODT) 4 MG disintegrating tablet every 8 (eight) hours   risperiDONE (RISPERDAL) 0.5 MG tablet Take 0.5 mg by mouth nightly   No current facility-administered medications on file prior to visit.   History reviewed. No pertinent family history.   Social History   Tobacco Use  Smoking Status Never Smoker  Smokeless Tobacco Never Used    Social History   Socioeconomic History   Marital status: Unknown  Tobacco Use   Smoking status: Never Smoker   Smokeless tobacco: Never Used  Scientific laboratory technician Use: Never used  Substance and Sexual Activity   Alcohol use: Yes   Drug use: Never   Objective:   Vitals:  11/21/20 1558  BP: 124/72  Pulse: 76  Temp: 36.7 C (98.1 F)  SpO2: 100%  Weight: 82 kg (180 lb 12.8 oz)  Height: 177.8 cm ('5\' 10"'$ )   Body mass index is 25.94 kg/m.  Physical Exam   He appears well on exam  His abdomen is soft and nontender. He has an easily reducible small left inguinal hernia. There is no evidence of right inguinal hernia or umbilical hernia with the patient both lying and standing with vigorous Valsalva maneuvers  Lungs  clear  CV RRR  Skin without rash    Assessment and Plan:    Left inguinal hernia    I have reviewed his notes in the electronic medical records. I reviewed his notes from his primary care provider. He does have a left inguinal hernia. I discussed hernias and abdominal anatomy with the patient in detail. We discussed hernia repairs with mesh. We discussed reasonings to repair hernias. We discussed risks of incarceration and strangulation of bowel without  hernia repair. Because of the symptoms, he wishes to proceed with surgery. I next discussed both the laparoscopic and open techniques. Given his previous prostate surgery and potential future penile implant, I would like to proceed with an open repair with mesh. This would be an open left inguinal hernia repair with mesh with anesthesiology doing a tap block preoperatively. We discussed the risk which includes but is not limited to bleeding, infection, injury to surrounding structures, nerve entrapment, chronic pain, use of mesh, hernia recurrence, cardiopulmonary issues, postoperative recovery, etc. He understands and wishes to proceed with surgery which will be scheduled.    Beverlee Nims, MD

## 2020-12-21 ENCOUNTER — Ambulatory Visit (HOSPITAL_COMMUNITY)
Admission: RE | Admit: 2020-12-21 | Discharge: 2020-12-21 | Disposition: A | Discharge status: Home / Self Care | Payer: Medicare Other | Attending: Surgery | Admitting: Surgery

## 2020-12-21 ENCOUNTER — Ambulatory Visit (HOSPITAL_COMMUNITY): Payer: Medicare Other | Admitting: Anesthesiology

## 2020-12-21 ENCOUNTER — Other Ambulatory Visit: Payer: Self-pay

## 2020-12-21 ENCOUNTER — Encounter (HOSPITAL_COMMUNITY): Payer: Self-pay | Admitting: Surgery

## 2020-12-21 ENCOUNTER — Encounter (HOSPITAL_COMMUNITY)
Admission: RE | Disposition: A | Discharge status: Home / Self Care | Payer: Self-pay | Source: Home / Self Care | Attending: Surgery

## 2020-12-21 ENCOUNTER — Ambulatory Visit (HOSPITAL_COMMUNITY): Payer: Medicare Other | Admitting: Vascular Surgery

## 2020-12-21 DIAGNOSIS — Z79899 Other long term (current) drug therapy: Secondary | ICD-10-CM | POA: Insufficient documentation

## 2020-12-21 DIAGNOSIS — F17200 Nicotine dependence, unspecified, uncomplicated: Secondary | ICD-10-CM | POA: Diagnosis not present

## 2020-12-21 DIAGNOSIS — I10 Essential (primary) hypertension: Secondary | ICD-10-CM | POA: Diagnosis not present

## 2020-12-21 DIAGNOSIS — Z8546 Personal history of malignant neoplasm of prostate: Secondary | ICD-10-CM | POA: Diagnosis not present

## 2020-12-21 DIAGNOSIS — G8918 Other acute postprocedural pain: Secondary | ICD-10-CM | POA: Diagnosis not present

## 2020-12-21 DIAGNOSIS — I4510 Unspecified right bundle-branch block: Secondary | ICD-10-CM | POA: Diagnosis not present

## 2020-12-21 DIAGNOSIS — K4090 Unilateral inguinal hernia, without obstruction or gangrene, not specified as recurrent: Secondary | ICD-10-CM | POA: Diagnosis not present

## 2020-12-21 HISTORY — PX: INGUINAL HERNIA REPAIR: SHX194

## 2020-12-21 HISTORY — PX: INSERTION OF MESH: SHX5868

## 2020-12-21 SURGERY — REPAIR, HERNIA, INGUINAL, ADULT
Anesthesia: Regional | Site: Inguinal | Laterality: Left

## 2020-12-21 MED ORDER — PROPOFOL 10 MG/ML IV BOLUS
INTRAVENOUS | Status: DC | PRN
  Administered 2020-12-21: 200 mg via INTRAVENOUS

## 2020-12-21 MED ORDER — ACETAMINOPHEN 500 MG PO TABS
1000.0000 mg | ORAL_TABLET | ORAL | Status: AC
  Administered 2020-12-21: 1000 mg via ORAL
  Filled 2020-12-21: qty 2

## 2020-12-21 MED ORDER — LACTATED RINGERS IV SOLN: INTRAVENOUS | Status: DC

## 2020-12-21 MED ORDER — ORAL CARE MOUTH RINSE: 15.0000 mL | Freq: Once | OROMUCOSAL | Status: AC

## 2020-12-21 MED ORDER — BUPIVACAINE LIPOSOME 1.3 % IJ SUSP
INTRAMUSCULAR | Status: DC | PRN
  Administered 2020-12-21: 10 mL

## 2020-12-21 MED ORDER — ENSURE PRE-SURGERY PO LIQD: 296.0000 mL | Freq: Once | ORAL | Status: DC

## 2020-12-21 MED ORDER — PROMETHAZINE HCL 25 MG/ML IJ SOLN: 6.2500 mg | INTRAMUSCULAR | Status: DC | PRN

## 2020-12-21 MED ORDER — BUPIVACAINE-EPINEPHRINE 0.5% -1:200000 IJ SOLN
INTRAMUSCULAR | Status: DC | PRN
  Administered 2020-12-21: 10 mL

## 2020-12-21 MED ORDER — PROPOFOL 10 MG/ML IV BOLUS
INTRAVENOUS | Status: AC
  Filled 2020-12-21: qty 20

## 2020-12-21 MED ORDER — OXYCODONE HCL 5 MG/5ML PO SOLN: 5.0000 mg | Freq: Once | ORAL | Status: DC | PRN

## 2020-12-21 MED ORDER — CEFAZOLIN SODIUM-DEXTROSE 2-4 GM/100ML-% IV SOLN
2.0000 g | INTRAVENOUS | Status: AC
  Administered 2020-12-21: 2 g via INTRAVENOUS
  Filled 2020-12-21: qty 100

## 2020-12-21 MED ORDER — MIDAZOLAM HCL 2 MG/2ML IJ SOLN: 1.0000 mg | Freq: Once | INTRAMUSCULAR | Status: AC

## 2020-12-21 MED ORDER — 0.9 % SODIUM CHLORIDE (POUR BTL) OPTIME
TOPICAL | Status: DC | PRN
  Administered 2020-12-21: 1000 mL

## 2020-12-21 MED ORDER — DEXAMETHASONE SODIUM PHOSPHATE 10 MG/ML IJ SOLN
INTRAMUSCULAR | Status: DC | PRN
  Administered 2020-12-21: 10 mg via INTRAVENOUS

## 2020-12-21 MED ORDER — LIDOCAINE 2% (20 MG/ML) 5 ML SYRINGE
INTRAMUSCULAR | Status: DC | PRN
  Administered 2020-12-21: 80 mg via INTRAVENOUS

## 2020-12-21 MED ORDER — ONDANSETRON HCL 4 MG/2ML IJ SOLN
INTRAMUSCULAR | Status: DC | PRN
  Administered 2020-12-21: 4 mg via INTRAVENOUS

## 2020-12-21 MED ORDER — FENTANYL CITRATE (PF) 100 MCG/2ML IJ SOLN
INTRAMUSCULAR | Status: AC
  Filled 2020-12-21: qty 2

## 2020-12-21 MED ORDER — CHLORHEXIDINE GLUCONATE CLOTH 2 % EX PADS: 6.0000 | MEDICATED_PAD | Freq: Once | CUTANEOUS | Status: DC

## 2020-12-21 MED ORDER — AMISULPRIDE (ANTIEMETIC) 5 MG/2ML IV SOLN: 10.0000 mg | Freq: Once | INTRAVENOUS | Status: DC | PRN

## 2020-12-21 MED ORDER — MIDAZOLAM HCL 2 MG/2ML IJ SOLN
INTRAMUSCULAR | Status: DC | PRN
  Administered 2020-12-21: 1 mg via INTRAVENOUS

## 2020-12-21 MED ORDER — FENTANYL CITRATE (PF) 250 MCG/5ML IJ SOLN
INTRAMUSCULAR | Status: DC | PRN
  Administered 2020-12-21: 50 ug via INTRAVENOUS

## 2020-12-21 MED ORDER — PHENYLEPHRINE HCL (PRESSORS) 10 MG/ML IV SOLN
INTRAVENOUS | Status: DC | PRN
  Administered 2020-12-21: 80 ug via INTRAVENOUS

## 2020-12-21 MED ORDER — OXYCODONE HCL 5 MG PO TABS
5.0000 mg | ORAL_TABLET | Freq: Once | ORAL | Status: DC | PRN
Start: 2020-12-21 — End: 2020-12-21

## 2020-12-21 MED ORDER — BUPIVACAINE HCL (PF) 0.5 % IJ SOLN
INTRAMUSCULAR | Status: DC | PRN
  Administered 2020-12-21: 20 mL

## 2020-12-21 MED ORDER — BUPIVACAINE-EPINEPHRINE (PF) 0.25% -1:200000 IJ SOLN
INTRAMUSCULAR | Status: AC
  Filled 2020-12-21: qty 30

## 2020-12-21 MED ORDER — CHLORHEXIDINE GLUCONATE 0.12 % MT SOLN
15.0000 mL | Freq: Once | OROMUCOSAL | Status: AC
  Administered 2020-12-21: 15 mL via OROMUCOSAL
  Filled 2020-12-21: qty 15

## 2020-12-21 MED ORDER — FENTANYL CITRATE (PF) 100 MCG/2ML IJ SOLN
INTRAMUSCULAR | Status: AC
  Administered 2020-12-21: 50 ug via INTRAVENOUS
  Filled 2020-12-21: qty 2

## 2020-12-21 MED ORDER — MIDAZOLAM HCL 2 MG/2ML IJ SOLN
INTRAMUSCULAR | Status: AC
  Administered 2020-12-21: 1 mg via INTRAVENOUS
  Filled 2020-12-21: qty 2

## 2020-12-21 MED ORDER — FENTANYL CITRATE (PF) 100 MCG/2ML IJ SOLN: 50.0000 ug | Freq: Once | INTRAMUSCULAR | Status: AC

## 2020-12-21 MED ORDER — MIDAZOLAM HCL 2 MG/2ML IJ SOLN
INTRAMUSCULAR | Status: AC
  Filled 2020-12-21: qty 2

## 2020-12-21 MED ORDER — LACTATED RINGERS IV SOLN: INTRAVENOUS | Status: DC | PRN

## 2020-12-21 MED ORDER — TRAMADOL HCL 50 MG PO TABS: 50.0000 mg | ORAL_TABLET | Freq: Four times a day (QID) | ORAL | 0 refills | Status: AC | PRN

## 2020-12-21 MED ORDER — FENTANYL CITRATE (PF) 100 MCG/2ML IJ SOLN
25.0000 ug | INTRAMUSCULAR | Status: DC | PRN
  Administered 2020-12-21 (×3): 50 ug via INTRAVENOUS

## 2020-12-21 MED ORDER — FENTANYL CITRATE (PF) 250 MCG/5ML IJ SOLN
INTRAMUSCULAR | Status: AC
  Filled 2020-12-21: qty 5

## 2020-12-21 SURGICAL SUPPLY — 33 items
BAG COUNTER SPONGE SURGICOUNT (BAG) IMPLANT
BLADE CLIPPER SURG (BLADE) IMPLANT
CHLORAPREP W/TINT 26 (MISCELLANEOUS) ×3 IMPLANT
COVER SURGICAL LIGHT HANDLE (MISCELLANEOUS) ×3 IMPLANT
DERMABOND ADVANCED (GAUZE/BANDAGES/DRESSINGS) ×1
DERMABOND ADVANCED .7 DNX12 (GAUZE/BANDAGES/DRESSINGS) ×2 IMPLANT
DRAIN PENROSE 1/2X12 LTX STRL (WOUND CARE) ×1 IMPLANT
DRAPE LAPAROTOMY TRNSV 102X78 (DRAPES) ×3 IMPLANT
ELECT REM PT RETURN 9FT ADLT (ELECTROSURGICAL) ×3
ELECTRODE REM PT RTRN 9FT ADLT (ELECTROSURGICAL) ×2 IMPLANT
GLOVE SURG SIGNA 7.5 PF LTX (GLOVE) ×3 IMPLANT
GOWN STRL REUS W/ TWL LRG LVL3 (GOWN DISPOSABLE) ×2 IMPLANT
GOWN STRL REUS W/ TWL XL LVL3 (GOWN DISPOSABLE) ×2 IMPLANT
GOWN STRL REUS W/TWL LRG LVL3 (GOWN DISPOSABLE) ×3
GOWN STRL REUS W/TWL XL LVL3 (GOWN DISPOSABLE) ×3
KIT BASIN OR (CUSTOM PROCEDURE TRAY) ×3 IMPLANT
KIT TURNOVER KIT B (KITS) ×3 IMPLANT
MESH PARIETEX PROGRIP LEFT (Mesh General) ×1 IMPLANT
NDL HYPO 25GX1X1/2 BEV (NEEDLE) ×2 IMPLANT
NEEDLE HYPO 25GX1X1/2 BEV (NEEDLE) ×3 IMPLANT
NS IRRIG 1000ML POUR BTL (IV SOLUTION) ×3 IMPLANT
PACK GENERAL/GYN (CUSTOM PROCEDURE TRAY) ×3 IMPLANT
PAD ARMBOARD 7.5X6 YLW CONV (MISCELLANEOUS) ×3 IMPLANT
PENCIL SMOKE EVACUATOR (MISCELLANEOUS) ×2 IMPLANT
SUT MON AB 4-0 PC3 18 (SUTURE) ×3 IMPLANT
SUT SILK 2 0 SH (SUTURE) ×1 IMPLANT
SUT VIC AB 2-0 CT1 27 (SUTURE) ×3
SUT VIC AB 2-0 CT1 TAPERPNT 27 (SUTURE) ×2 IMPLANT
SUT VIC AB 3-0 CT1 27 (SUTURE) ×3
SUT VIC AB 3-0 CT1 TAPERPNT 27 (SUTURE) ×2 IMPLANT
SYR CONTROL 10ML LL (SYRINGE) ×3 IMPLANT
TOWEL GREEN STERILE (TOWEL DISPOSABLE) ×3 IMPLANT
TOWEL GREEN STERILE FF (TOWEL DISPOSABLE) ×3 IMPLANT

## 2020-12-21 NOTE — Interval H&P Note (Signed)
History and Physical Interval Note: no change in H and P  12/21/2020 12:50 PM  [facility]  has presented today for surgery, with the diagnosis of LEFT INGUINAL HERNIA.  The various methods of treatment have been discussed with the patient and family. After consideration of risks, benefits and other options for treatment, the patient has consented to  Procedure(s): OPEN LEFT INGUINAL HERNIA REPAIR WITH MESH (Left) as a surgical intervention.  The patient's history has been reviewed, patient examined, no change in status, stable for surgery.  I have reviewed the patient's chart and labs.  Questions were answered to the patient's satisfaction.     Coralie Keens

## 2020-12-21 NOTE — Anesthesia Preprocedure Evaluation (Addendum)
Anesthesia Evaluation  Patient identified by MRN, date of birth, ID band Patient awake    Reviewed: Allergy & Precautions, NPO status , Patient's Chart, lab work & pertinent test results  History of Anesthesia Complications (+) PONV and history of anesthetic complications  Airway Mallampati: III  TM Distance: >3 FB Neck ROM: Full    Dental no notable dental hx. (+)    Pulmonary Current Smoker and Patient abstained from smoking.,    Pulmonary exam normal breath sounds clear to auscultation       Cardiovascular hypertension, Normal cardiovascular exam+ dysrhythmias (RBBB)  Rhythm:Regular Rate:Normal  Thoracic ascending aortic aneurysm   Neuro/Psych PSYCHIATRIC DISORDERS Depression Bipolar Disorder    GI/Hepatic Neg liver ROS, GERD  ,  Endo/Other  negative endocrine ROS  Renal/GU negative Renal ROS   H;/o prostate cancer    Musculoskeletal  (+) Arthritis ,   Abdominal   Peds negative pediatric ROS (+)  Hematology negative hematology ROS (+)   Anesthesia Other Findings   Reproductive/Obstetrics                            Anesthesia Physical Anesthesia Plan  ASA: 2  Anesthesia Plan: General and Regional   Post-op Pain Management: GA combined w/ Regional for post-op pain   Induction: Intravenous  PONV Risk Score and Plan: 2 and Treatment may vary due to age or medical condition, Ondansetron, Dexamethasone and Midazolam  Airway Management Planned: LMA  Additional Equipment: None  Intra-op Plan:   Post-operative Plan: Extubation in OR  Informed Consent: I have reviewed the patients History and Physical, chart, labs and discussed the procedure including the risks, benefits and alternatives for the proposed anesthesia with the patient or authorized representative who has indicated his/her understanding and acceptance.     Dental advisory given  Plan Discussed with: CRNA and  Anesthesiologist  Anesthesia Plan Comments: (TAP block on left. GA/LMA. Norton Blizzard, MD  )       Anesthesia Quick Evaluation

## 2020-12-21 NOTE — Op Note (Signed)
OPEN LEFT INGUINAL HERNIA REPAIR WITH MESH, INSERTION OF MESH  Procedure Note  [facility] 12/21/2020   Pre-op Diagnosis: LEFT INGUINAL HERNIA     Post-op Diagnosis: same  Procedure(s): OPEN LEFT INGUINAL HERNIA REPAIR WITH MESH INSERTION OF MESH  Surgeon(s): Coralie Keens, MD  Anesthesia: General  Staff:  Circulator: Margy Clarks, RN; Celene Squibb, RN Scrub Person: Jacky Kindle, Jim Like  Estimated Blood Loss: Minimal               Findings: The patient was found to have an indirect left inguinal hernia which was repaired with a piece of large Prolene ProGrip mesh from [employer]  Procedure: Patient brought to operating identifies a correct patient.  He was placed on the operating table general anesthesia was induced.  He had already had a left tap block by anesthesiology preoperatively.  His abdomen was prepped and draped in usual sterile fashion.  I anesthetized skin left inguinal area with Marcaine.  I made incision with a scalpel.  I then dissected down through Scarpa's fascia with cautery.  The external bleak fascia was then identified and opened with internal and external ring.  This testicular cord and structures were controlled the Penrose drain.  The patient had a moderate sized left inguinal hernia.  I was able to separate the hernia sac from the cord structures.  There was a lipoma of the cord which I excised with the cautery.  I opened the sac and reduced all contents back into the abdominal cavity.  I dissected the sac down to its base and then tied off with a 2-0 silk suture.  I then excised the redundant sac with the cautery.  Next a large piece of Prolene ProGrip mesh was brought to the field.  I placed the mesh against the pubic tubercle and brought it around the cord structures.  I then sutured the mesh in place with a single 2-0 Vicryl suture to the pubic tubercle.  Wide coverage of the inguinal floor and the internal ring appeared to be  achieved.  I then closed the external bleak fascia over the top of the mesh with a running 2-0 Vicryl suture.  Hemostasis appeared to be achieved.  I then closed the Scarpa's fascia layer with 3-0 Vicryl sutures and closed the skin with running 4-0 Monocryl.  Dermabond was then applied.  The patient tolerated procedure well.  All the counts were correct at the end of the procedure.  The patient was then extubated in the operating room and taken in a stable condition to the recovery room.          Coralie Keens   Date: 12/21/2020  Time: 1:54 PM

## 2020-12-21 NOTE — Transfer of Care (Signed)
Immediate Anesthesia Transfer of Care Note  Patient: [facility]  Procedure(s) Performed: OPEN LEFT INGUINAL HERNIA REPAIR WITH MESH (Left) INSERTION OF MESH (Left: Inguinal)  Patient Location: PACU  Anesthesia Type:General  Level of Consciousness: awake and drowsy  Airway & Oxygen Therapy: Patient Spontanous Breathing  Post-op Assessment: Report given to RN and Post -op Vital signs reviewed and stable  Post vital signs: Reviewed and stable  Last Vitals:  Vitals Value Taken Time  BP 102/70 12/21/20 1358  Temp    Pulse 65 12/21/20 1400  Resp 11 12/21/20 1400  SpO2 96 % 12/21/20 1400  Vitals shown include unvalidated device data.  Last Pain:  Vitals:   12/21/20 1234  TempSrc:   PainSc: 0-No pain         Complications: No notable events documented.

## 2020-12-21 NOTE — Anesthesia Procedure Notes (Addendum)
Procedure Name: LMA Insertion Date/Time: 12/21/2020 1:17 PM Performed by: Clearnce Sorrel, CRNA Pre-anesthesia Checklist: Patient identified, Emergency Drugs available, Suction available, Patient being monitored and Timeout performed Patient Re-evaluated:Patient Re-evaluated prior to induction Oxygen Delivery Method: Circle System Utilized Preoxygenation: Pre-oxygenation with 100% oxygen Induction Type: IV induction LMA: LMA inserted LMA Size: 4.0 Number of attempts: 1 Placement Confirmation: positive ETCO2 Tube secured with: Tape Dental Injury: Teeth and Oropharynx as per pre-operative assessment  Comments: Dory Larsen, SRNA

## 2020-12-21 NOTE — Anesthesia Postprocedure Evaluation (Signed)
Anesthesia Post Note  Patient: Philip Stout  Procedure(s) Performed: OPEN LEFT INGUINAL HERNIA REPAIR WITH MESH (Left) INSERTION OF MESH (Left: Inguinal)     Patient location during evaluation: PACU Anesthesia Type: Regional Level of consciousness: awake and alert Pain management: pain level controlled Vital Signs Assessment: post-procedure vital signs reviewed and stable Respiratory status: spontaneous breathing, nonlabored ventilation and respiratory function stable Cardiovascular status: blood pressure returned to baseline and stable Postop Assessment: no apparent nausea or vomiting Anesthetic complications: no   No notable events documented.  Last Vitals:  Vitals:   12/21/20 1430 12/21/20 1445  BP: 119/80 125/83  Pulse: 68 66  Resp: 16 16  Temp:    SpO2: 98% 100%    Last Pain:  Vitals:   12/21/20 1440  TempSrc:   PainSc: 8                  Candra R Winner Valeriano

## 2020-12-21 NOTE — Anesthesia Procedure Notes (Signed)
Anesthesia Regional Block: TAP block   Pre-Anesthetic Checklist: , timeout performed,  Correct Patient, Correct Site, Correct Laterality,  Correct Procedure, Correct Position, site marked,  Risks and benefits discussed,  Surgical consent,  Pre-op evaluation,  At surgeon's request and post-op pain management  Laterality: Left  Prep: chloraprep       Needles:  Injection technique: Single-shot  Needle Type: Echogenic Stimulator Needle     Needle Length: 10cm  Needle Gauge: 20     Additional Needles:   Procedures:,,,, ultrasound used (permanent image in chart),,     Nerve Stimulator or Paresthesia:  Response: quadraceps contraction, 0.45 mA  Additional Responses:   Narrative:  Start time: 12/21/2020 12:55 PM End time: 12/21/2020 1:00 PM Injection made incrementally with aspirations every 5 mL.  Performed by: Personally  Anesthesiologist: Merlinda Frederick, MD  Additional Notes: Functioning IV was confirmed and monitors were applied.  Sterile prep and drape,hand hygiene and sterile gloves were used. Ultrasound guidance: relevant anatomy identified, needle position confirmed, local anesthetic spread visualized around nerve(s)., vascular puncture avoided.  Image printed for medical record. Negative aspiration and negative test dose prior to incremental administration of local anesthetic. The patient tolerated the procedure well.

## 2020-12-21 NOTE — Discharge Instructions (Signed)
[facility] _____[facility]  UMBILICAL OR INGUINAL HERNIA REPAIR: POST OP INSTRUCTIONS  Always review your discharge instruction sheet given to you by the facility where your surgery was performed. IF YOU HAVE DISABILITY OR FAMILY LEAVE FORMS, YOU MUST BRING THEM TO THE OFFICE FOR PROCESSING.   DO NOT GIVE THEM TO YOUR DOCTOR.  1. A  prescription for pain medication may be given to you upon discharge.  Take your pain medication as prescribed, if needed.  If narcotic pain medicine is not needed, then you may take acetaminophen (Tylenol) or ibuprofen (Advil) as needed. 2. Take your usually prescribed medications unless otherwise directed. If you need a refill on your pain medication, please contact your pharmacy.  They will contact our office to request authorization. Prescriptions will not be filled after 5 pm or on week-ends. 3. You should follow a light diet the first 24 hours after arrival home, such as soup and crackers, etc.  Be sure to include lots of fluids daily.  Resume your normal diet the day after surgery. 4.Most patients will experience some swelling and bruising around the umbilicus or in the groin and scrotum.  Ice packs and reclining will help.  Swelling and bruising can take several days to resolve.  6. It is common to experience some constipation if taking pain medication after surgery.  Increasing fluid intake and taking a stool softener (such as Colace) will usually help or prevent this problem from occurring.  A mild laxative (Milk of Magnesia or Miralax) should be taken according to package directions if there are no bowel movements after 48 hours. 7. Unless discharge instructions indicate otherwise, you may remove your bandages 24-48 hours after surgery, and you may shower at that time.  You may have steri-strips (small skin tapes) in place directly over the incision.  These strips should be left on the skin for 7-10 days.  If your surgeon used skin glue on the  incision, you may shower in 24 hours.  The glue will flake off over the next 2-3 weeks.  Any sutures or staples will be removed at the office during your follow-up visit. 8. ACTIVITIES:  You may resume regular (light) daily activities beginning the next day--such as daily self-care, walking, climbing stairs--gradually increasing activities as tolerated.  You may have sexual intercourse when it is comfortable.  Refrain from any heavy lifting or straining until approved by your doctor.  a.You may drive when you are no longer taking prescription pain medication, you can comfortably wear a seatbelt, and you can safely maneuver your car and apply brakes. b.RETURN TO WORK:   _____________________________________________  9.You should see your doctor in the office for a follow-up appointment approximately 2-3 weeks after your surgery.  Make sure that you call for this appointment within a day or two after you arrive home to insure a convenient appointment time. 10.OTHER INSTRUCTIONS: ___OK TO SHOWER STARTING TOMORROW ICE PACK, TYLENOL, AND IBUPROFEN ALSO FOR PAIN NO LIFTING MORE THAN 15 TO 20 POUNDS FOR 4 WEEKS______________________    _____________________________________  WHEN TO CALL YOUR DOCTOR: Fever over 101.0 Inability to urinate Nausea and/or vomiting Extreme swelling or bruising Continued bleeding from incision. Increased pain, redness, or drainage from the incision  The clinic staff is available to answer your questions during regular business hours.  Please don't hesitate to call and ask to speak to one of the nurses for clinical concerns.  If you have a medical emergency, go to the nearest emergency room or call  [phone].  A surgeon from [facility] is always on call at the hospital   [address], [place], [place], [place]  [address] ?  [address], [place], [place]   [address] [phone] ? [phone] ? FAX [phone] Web site: [email or web address]

## 2020-12-22 ENCOUNTER — Encounter (HOSPITAL_COMMUNITY): Payer: Self-pay | Admitting: Surgery

## 2020-12-26 ENCOUNTER — Encounter (HOSPITAL_COMMUNITY): Payer: Self-pay | Admitting: Surgery

## 2021-01-11 DIAGNOSIS — N393 Stress incontinence (female) (male): Secondary | ICD-10-CM | POA: Diagnosis not present

## 2021-01-11 DIAGNOSIS — N5231 Erectile dysfunction following radical prostatectomy: Secondary | ICD-10-CM | POA: Diagnosis not present

## 2021-01-11 DIAGNOSIS — N1831 Chronic kidney disease, stage 3a: Secondary | ICD-10-CM | POA: Diagnosis not present

## 2021-01-11 DIAGNOSIS — Z8546 Personal history of malignant neoplasm of prostate: Secondary | ICD-10-CM | POA: Diagnosis not present

## 2021-01-26 DIAGNOSIS — U071 COVID-19: Secondary | ICD-10-CM | POA: Diagnosis not present

## 2021-03-16 DIAGNOSIS — F3340 Major depressive disorder, recurrent, in remission, unspecified: Secondary | ICD-10-CM | POA: Diagnosis not present

## 2021-03-16 DIAGNOSIS — F411 Generalized anxiety disorder: Secondary | ICD-10-CM | POA: Diagnosis not present

## 2021-04-05 DIAGNOSIS — N1831 Chronic kidney disease, stage 3a: Secondary | ICD-10-CM | POA: Diagnosis not present

## 2021-04-05 DIAGNOSIS — R7301 Impaired fasting glucose: Secondary | ICD-10-CM | POA: Diagnosis not present

## 2021-04-05 DIAGNOSIS — E7800 Pure hypercholesterolemia, unspecified: Secondary | ICD-10-CM | POA: Diagnosis not present

## 2021-04-05 DIAGNOSIS — I700 Atherosclerosis of aorta: Secondary | ICD-10-CM | POA: Diagnosis not present

## 2021-04-05 DIAGNOSIS — I10 Essential (primary) hypertension: Secondary | ICD-10-CM | POA: Diagnosis not present

## 2021-04-18 ENCOUNTER — Other Ambulatory Visit: Payer: Self-pay | Admitting: Family Medicine

## 2021-04-18 DIAGNOSIS — D3500 Benign neoplasm of unspecified adrenal gland: Secondary | ICD-10-CM

## 2021-05-15 ENCOUNTER — Other Ambulatory Visit: Payer: Medicare Other

## 2021-05-28 DIAGNOSIS — F3340 Major depressive disorder, recurrent, in remission, unspecified: Secondary | ICD-10-CM | POA: Diagnosis not present

## 2021-05-28 DIAGNOSIS — F411 Generalized anxiety disorder: Secondary | ICD-10-CM | POA: Diagnosis not present

## 2021-06-08 DIAGNOSIS — M791 Myalgia, unspecified site: Secondary | ICD-10-CM | POA: Diagnosis not present

## 2021-06-08 DIAGNOSIS — R7301 Impaired fasting glucose: Secondary | ICD-10-CM | POA: Diagnosis not present

## 2021-06-08 DIAGNOSIS — N1831 Chronic kidney disease, stage 3a: Secondary | ICD-10-CM | POA: Diagnosis not present

## 2021-06-08 DIAGNOSIS — E785 Hyperlipidemia, unspecified: Secondary | ICD-10-CM | POA: Diagnosis not present

## 2021-07-24 DIAGNOSIS — F411 Generalized anxiety disorder: Secondary | ICD-10-CM | POA: Diagnosis not present

## 2021-07-24 DIAGNOSIS — F3340 Major depressive disorder, recurrent, in remission, unspecified: Secondary | ICD-10-CM | POA: Diagnosis not present

## 2021-10-23 DIAGNOSIS — K219 Gastro-esophageal reflux disease without esophagitis: Secondary | ICD-10-CM | POA: Diagnosis not present

## 2021-10-23 DIAGNOSIS — E7800 Pure hypercholesterolemia, unspecified: Secondary | ICD-10-CM | POA: Diagnosis not present

## 2021-10-23 DIAGNOSIS — I129 Hypertensive chronic kidney disease with stage 1 through stage 4 chronic kidney disease, or unspecified chronic kidney disease: Secondary | ICD-10-CM | POA: Diagnosis not present

## 2021-10-23 DIAGNOSIS — Z0000 Encounter for general adult medical examination without abnormal findings: Secondary | ICD-10-CM | POA: Diagnosis not present

## 2021-10-23 DIAGNOSIS — Z125 Encounter for screening for malignant neoplasm of prostate: Secondary | ICD-10-CM | POA: Diagnosis not present

## 2021-10-29 ENCOUNTER — Other Ambulatory Visit: Payer: Self-pay | Admitting: Family Medicine

## 2021-10-29 DIAGNOSIS — D3500 Benign neoplasm of unspecified adrenal gland: Secondary | ICD-10-CM

## 2021-10-30 DIAGNOSIS — F3340 Major depressive disorder, recurrent, in remission, unspecified: Secondary | ICD-10-CM | POA: Diagnosis not present

## 2021-10-30 DIAGNOSIS — F411 Generalized anxiety disorder: Secondary | ICD-10-CM | POA: Diagnosis not present

## 2021-11-09 ENCOUNTER — Other Ambulatory Visit: Payer: Self-pay | Admitting: Family Medicine

## 2021-11-09 DIAGNOSIS — D3500 Benign neoplasm of unspecified adrenal gland: Secondary | ICD-10-CM

## 2021-11-21 IMAGING — US US PELVIS LIMITED
1 series · 5 of 5 positions shown · non-contrast
Comparison: None.

CLINICAL DATA: Left groin mass for 1 year, changing at times.

EXAM:
ULTRASOUND OF LEFT GROIN SOFT TISSUES
TECHNIQUE: Ultrasound examination of the groin soft tissues was performed in
the area of clinical concern.

[Series 1: us pelvis limited · 0.09mm/px · 5 acquisitions, 5 frames shown]
[im 1/5]
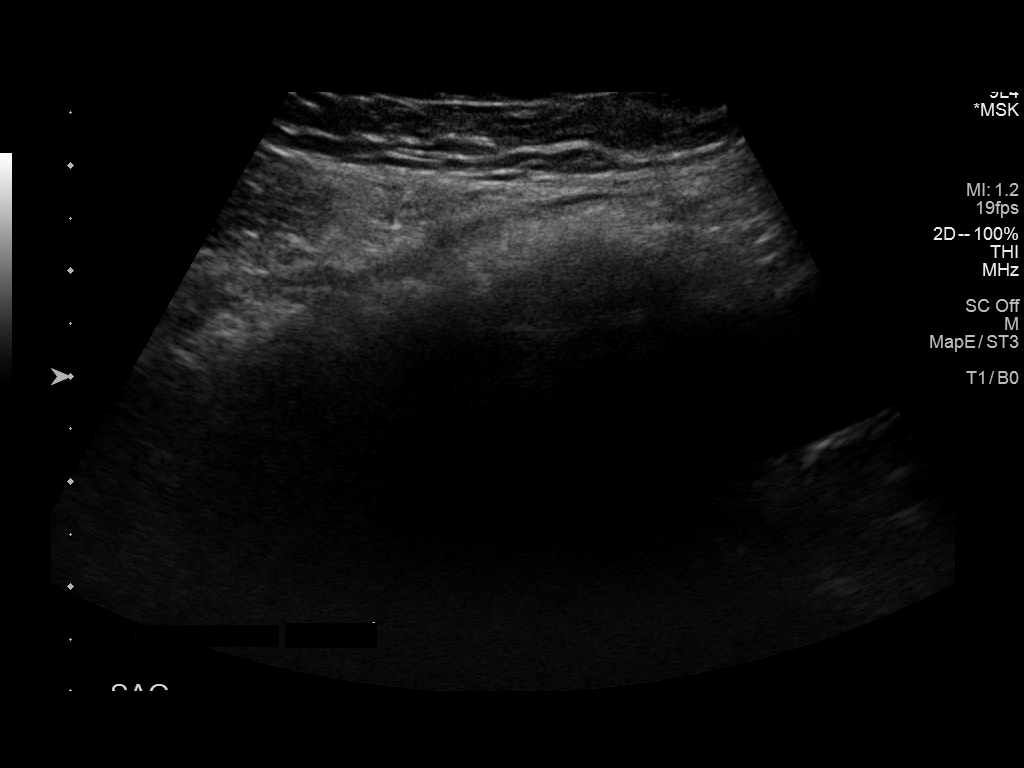
[im 2/5]
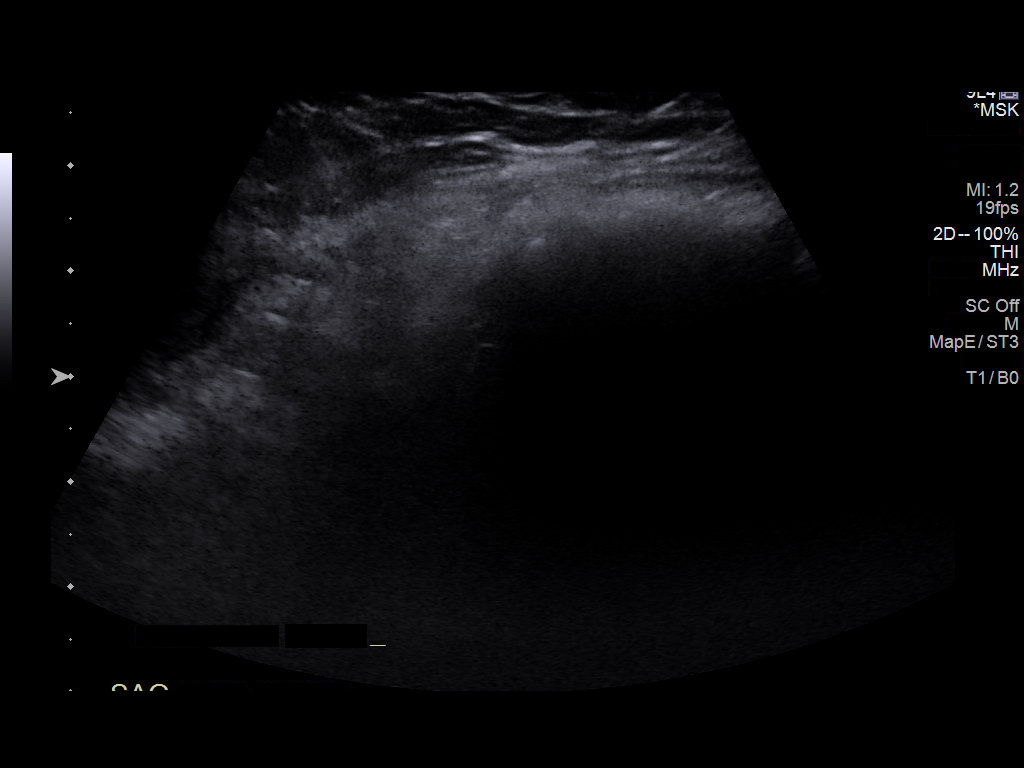
[im 3/5]
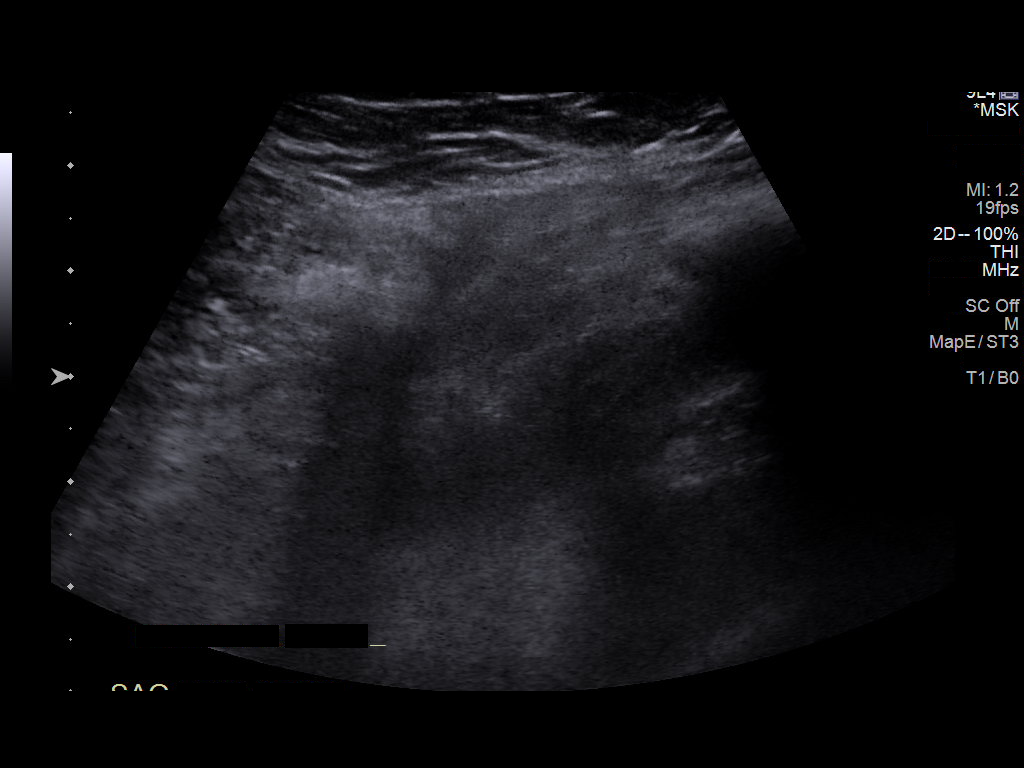
[im 4/5]
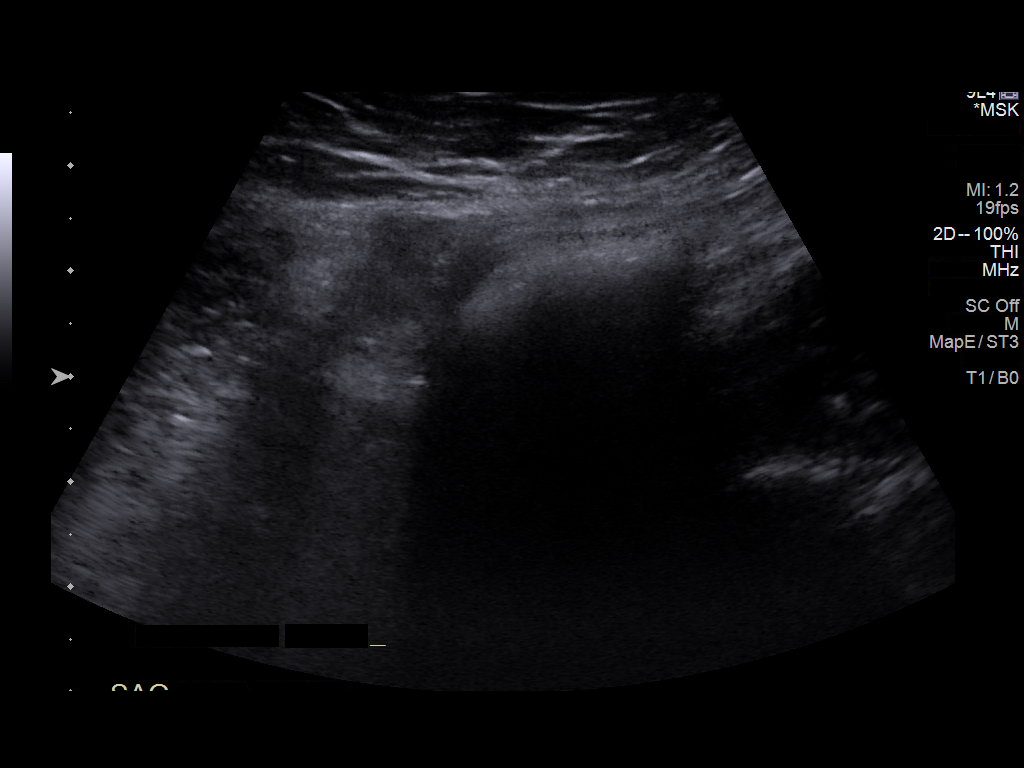
[im 5/5]
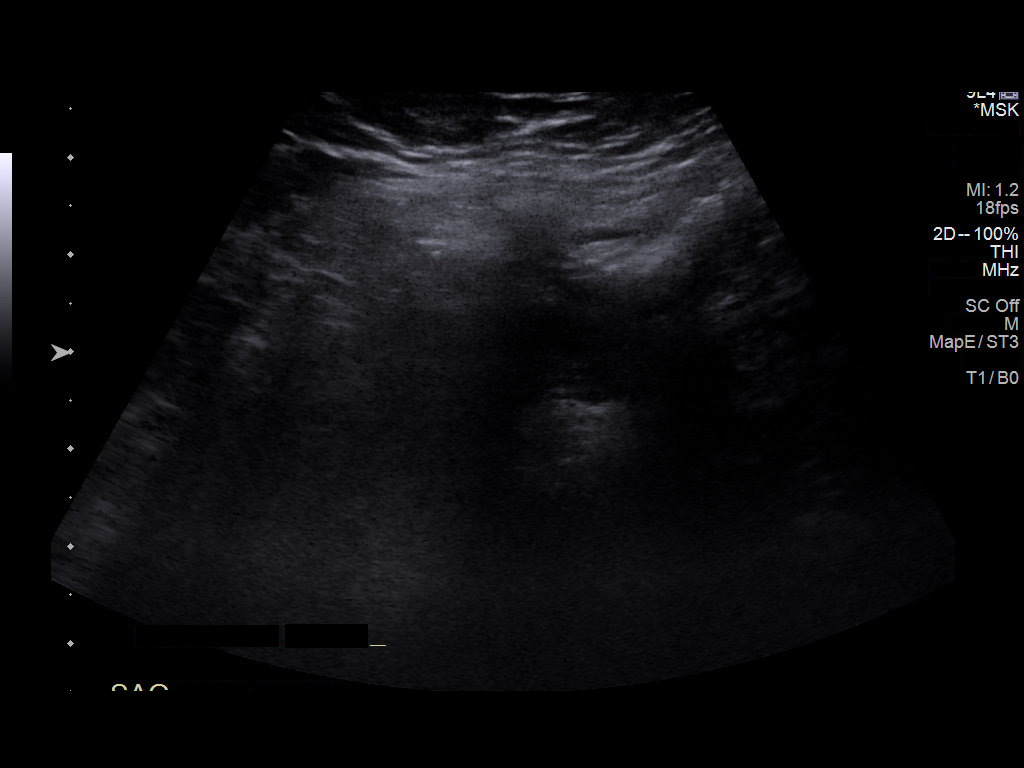

[5 of 5 positions shown; findings below may reference images not displayed]

FINDINGS: No defined mass.

Findings consistent with an inguinal hernia, with bowel and
peritoneal fat protruding into the inguinal canal with Valsalva.
IMPRESSION: 1. Positive exam for a left inguinal hernia, bowel and peritoneal
fat extending into the inguinal canal with Valsalva.

## 2021-12-27 IMAGING — US US ABDOMINAL AORTA SCREENING AAA
1 series · 14 of 25 positions shown · non-contrast
Comparison: None.

CLINICAL DATA: Abdominal aortic aneurysm

EXAM:
US ABDOMINAL AORTA MEDICARE SCREENING
TECHNIQUE: Ultrasound examination of the abdominal aorta was performed as a
screening evaluation for abdominal aortic aneurysm.

[Series 1: us abdominal aorta screening aaa · 0.30mm/px · 14 of 26 slices shown]
[im 1/26]
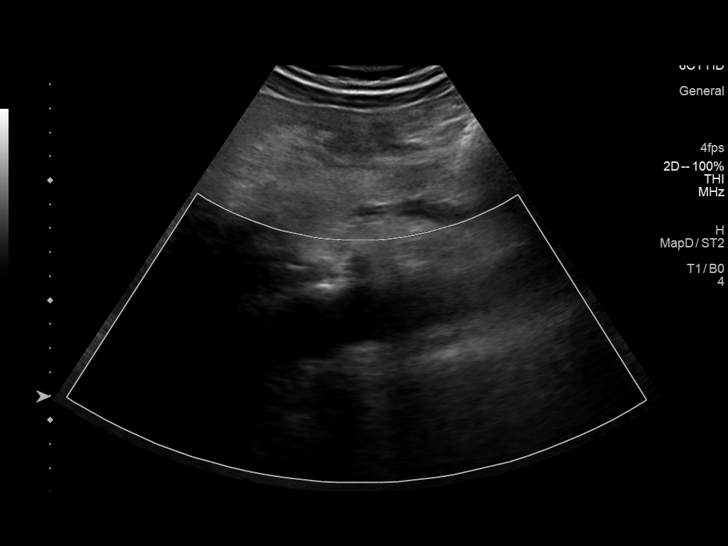
[im 3/26]
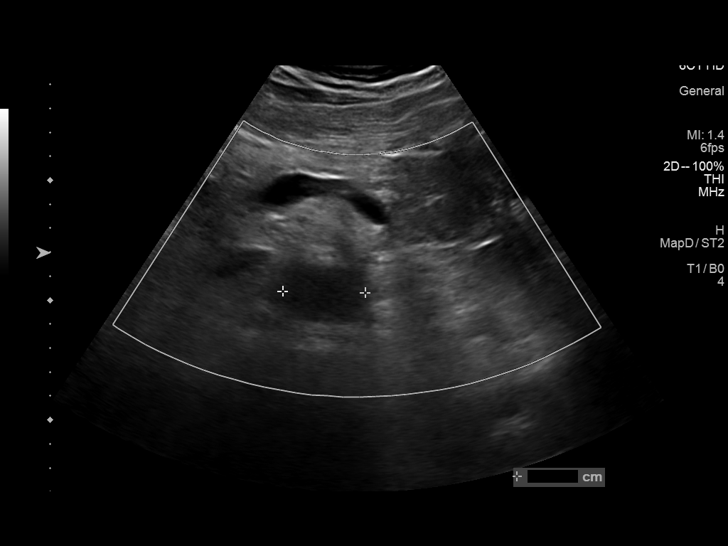
[im 5/26]
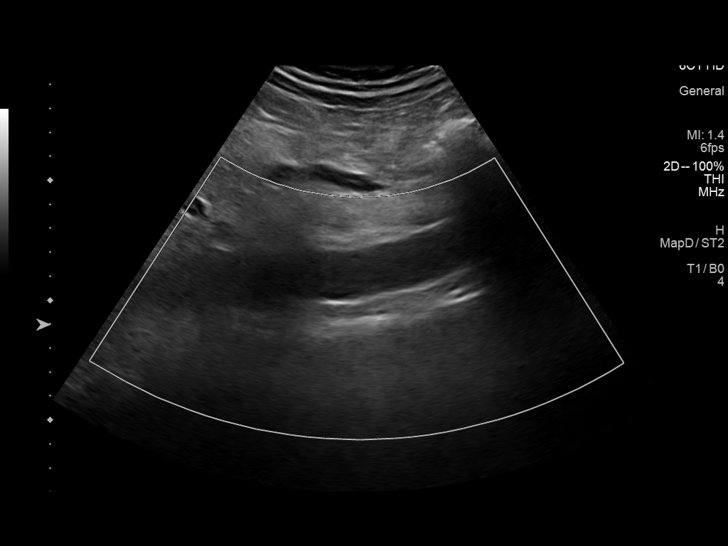
[im 7/26]
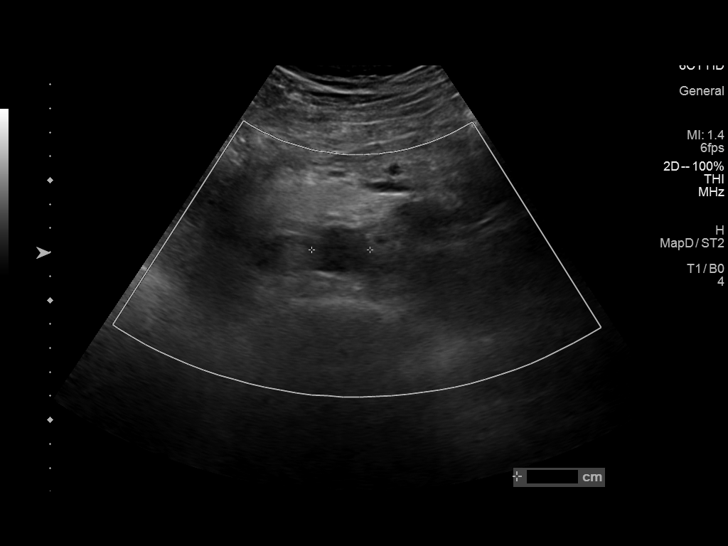
[im 9/26]
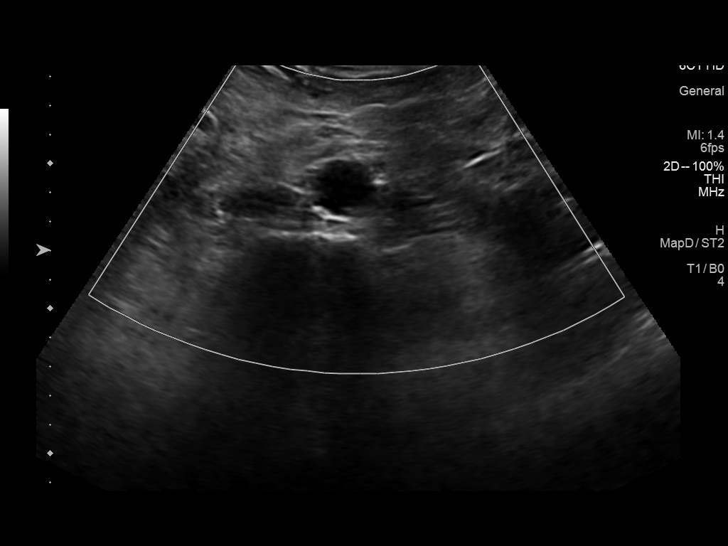
[im 10/26]
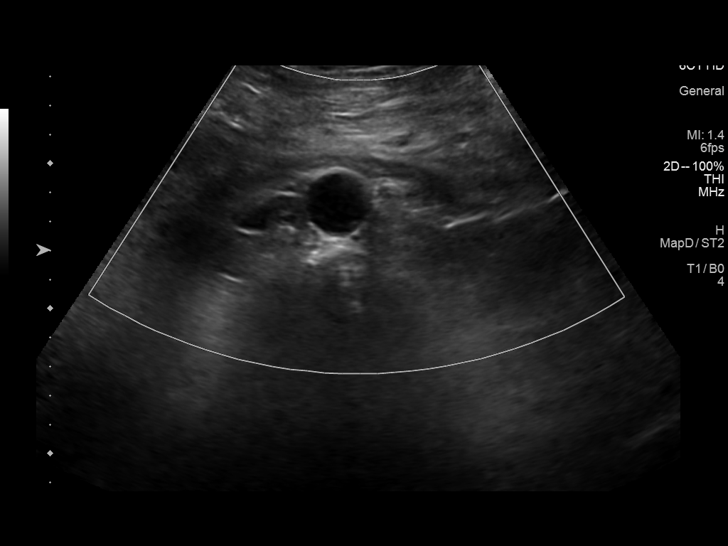
[im 12/26]
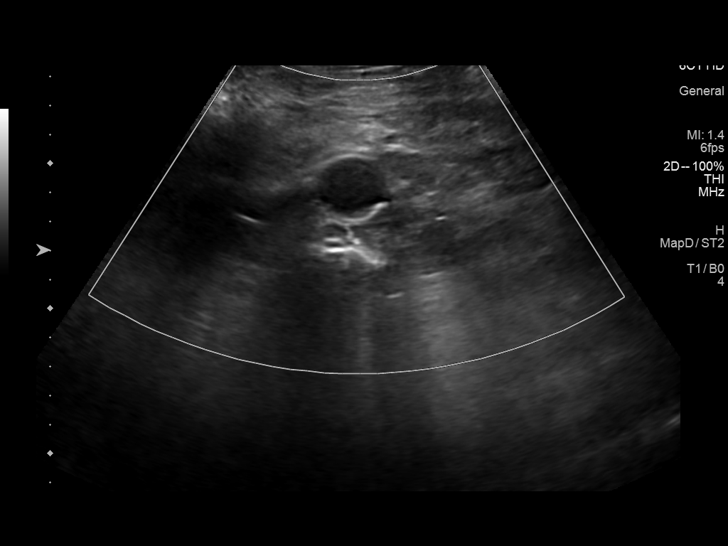
[im 14/26]
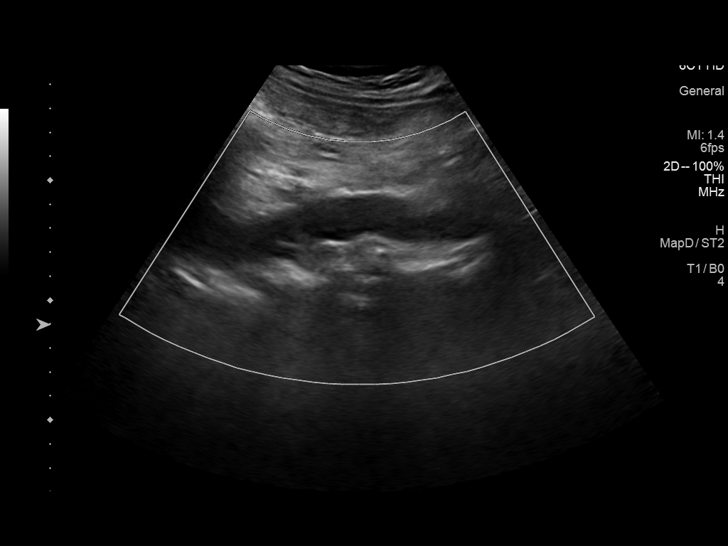
[im 16/26]
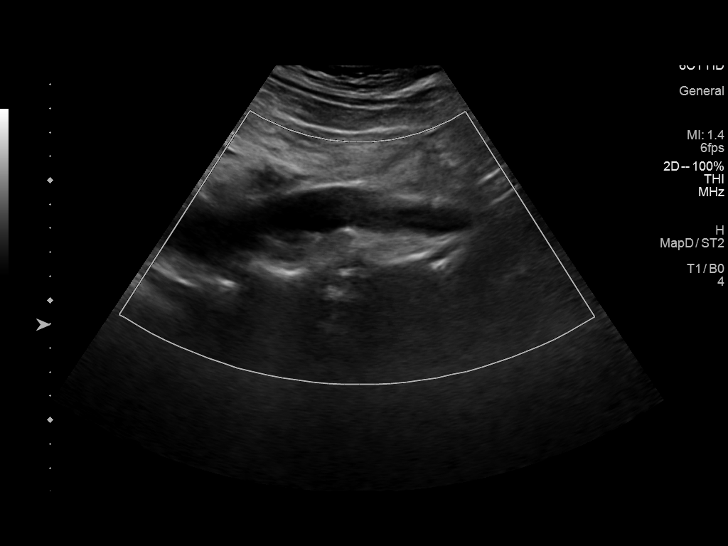
[im 17/26]
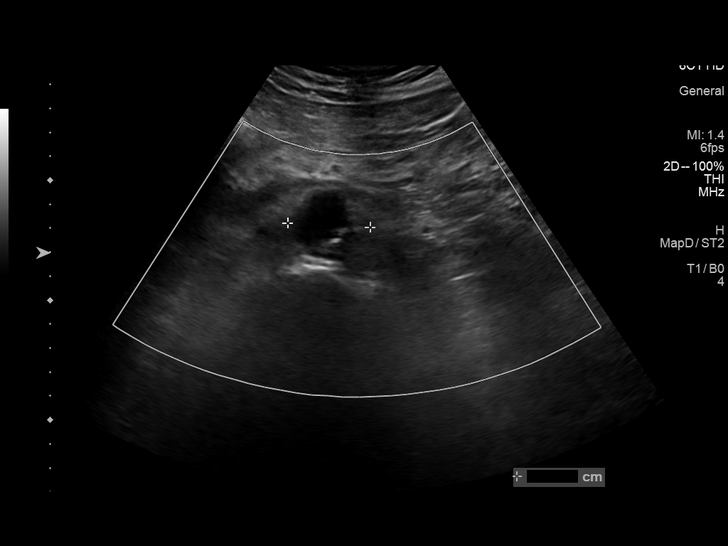
[im 19/26]
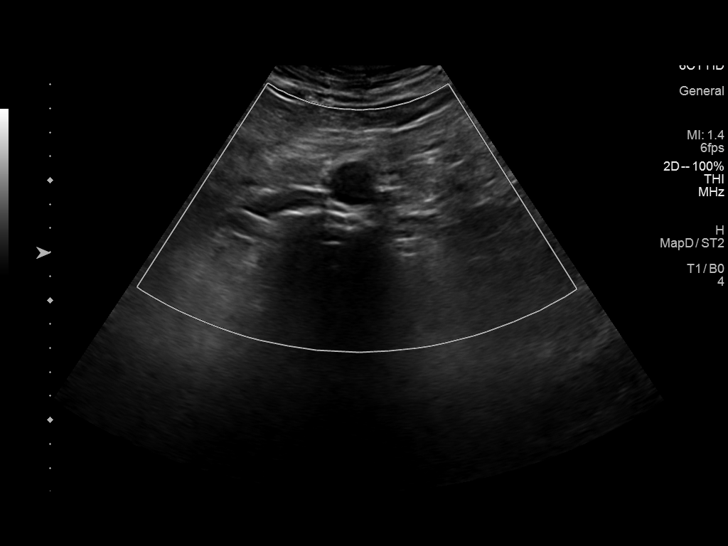
[im 21/26]
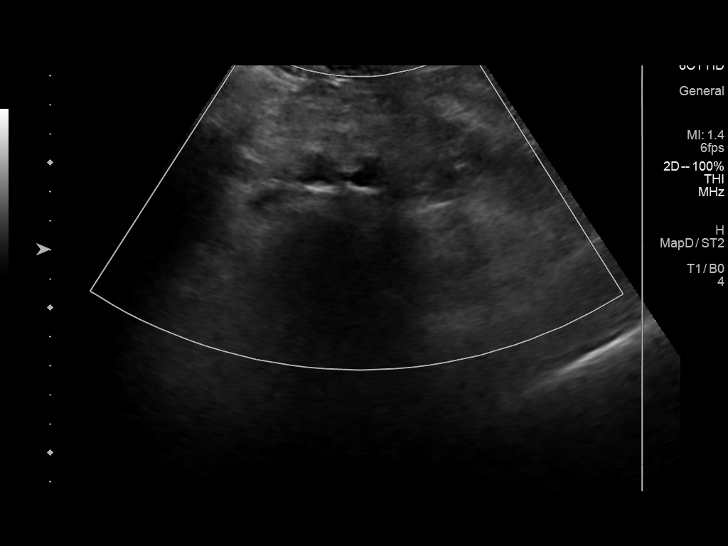
[im 23/26]
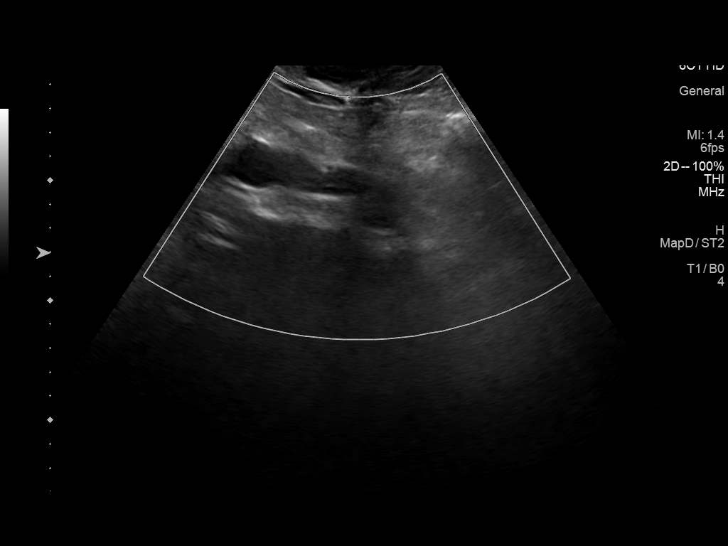
[im 26/26]
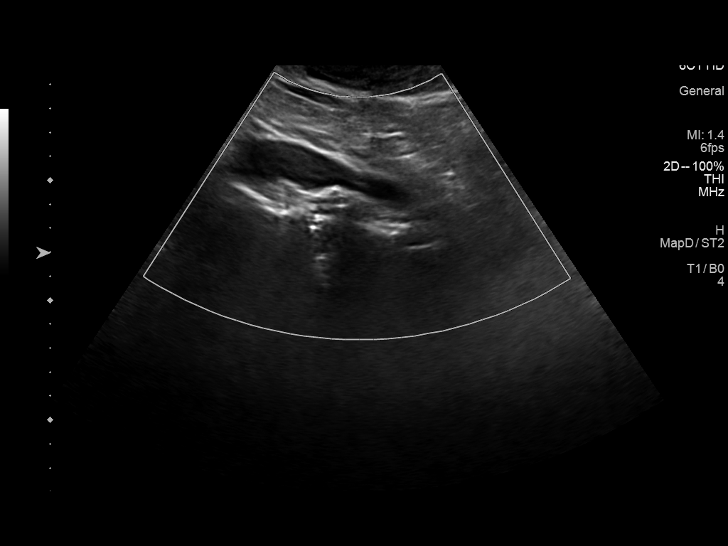

[14 of 25 positions shown; findings below may reference images not displayed]

FINDINGS: The examination is technically limited and cine images were not
obtained.

Abdominal aortic measurements as follows:

Proximal:  3.1 x 3.4 cm

Mid:  2.5 x 2.4 cm

Distal: 3.6 x 3.5 cm. These images, however, are limited by the
depth of the vessel and poor delineation of the posterior wall of
the aorta at this level. I suspect the aorta truly measures roughly
2.1 by 2.3 cm in greatest dimension, however, exact distinction
cannot be made on the presented images.
IMPRESSION: Possible infrarenal abdominal aortic aneurysm. As noted above, due
to technical limitation of the examination, exact delineation of the
margins of the infrarenal abdominal aorta is limited. Dedicated CT
imaging of the abdominal aorta would be helpful to better assess the
infrarenal abdominal aorta.

## 2022-01-09 DIAGNOSIS — M25561 Pain in right knee: Secondary | ICD-10-CM | POA: Diagnosis not present

## 2022-02-04 IMAGING — CT CT CTA ABD/PEL W/CM AND/OR W/O CM
1 of 3 series · 13 of 32 positions shown, 17 images · IV contrast (iopamidol)
Comparison: Ultrasound abdominal aorta 10/26/2020

CLINICAL DATA: Possible infrarenal abdominal aortic aneurysm
identified on ultrasound. History of prostate cancer and right hip
replacement.

EXAM:
CTA ABDOMEN AND PELVIS WITHOUT AND WITH CONTRAST
TECHNIQUE: Multidetector CT imaging of the abdomen and pelvis was performed
using the standard protocol during bolus administration of
intravenous contrast. Multiplanar reconstructed images and MIPs were
obtained and reviewed to evaluate the vascular anatomy.
CONTRAST:  75mL RE2DEM-TER IOPAMIDOL (RE2DEM-TER) INJECTION 76%

[Series 4: pre stent angio · axial · non-contrast · 0.79mm/px · z∈[-489,-71]mm · 13 of 231 slices shown, 17 images]
[im 11/231  soft-tissue]
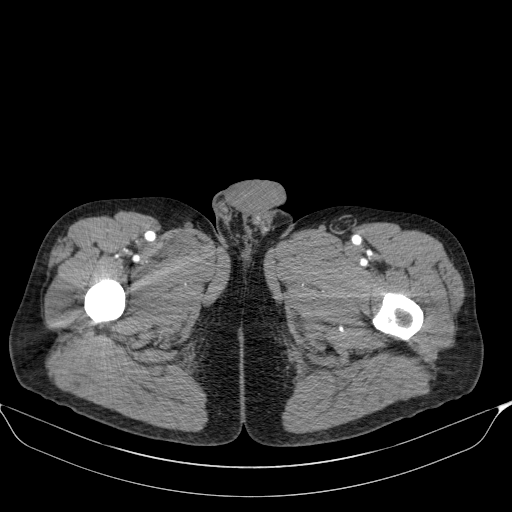
[im 11/231  bone]
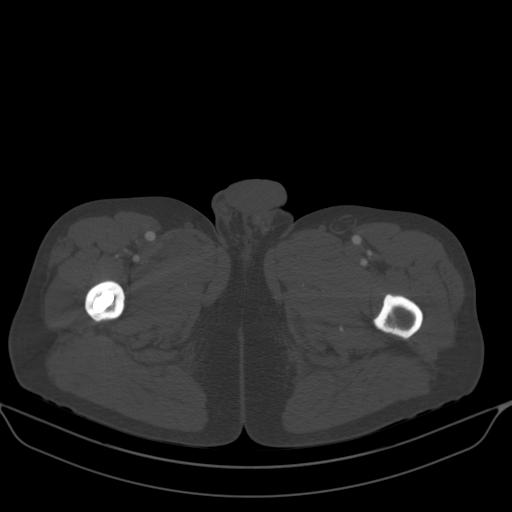
[im 33/231  soft-tissue]
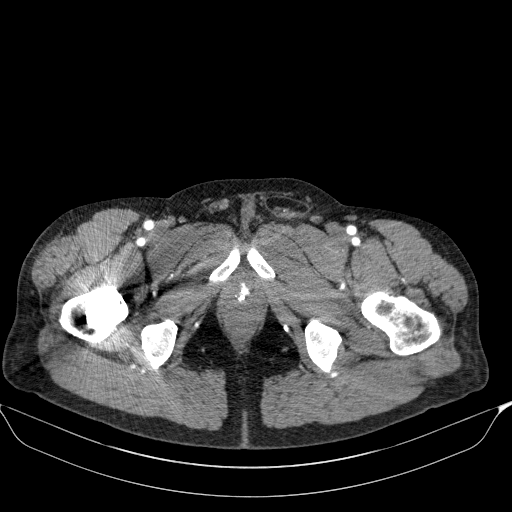
[im 55/231  soft-tissue]
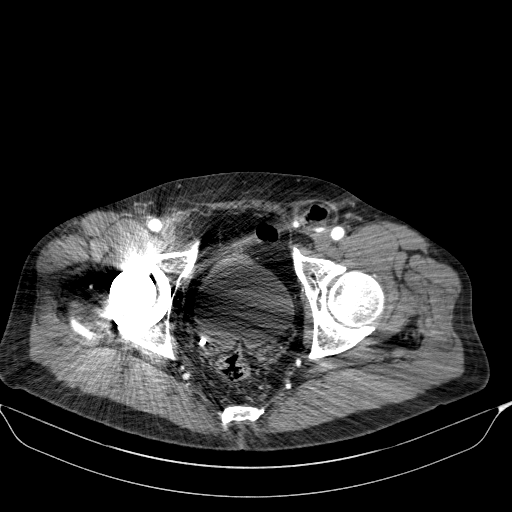
[im 77/231  soft-tissue]
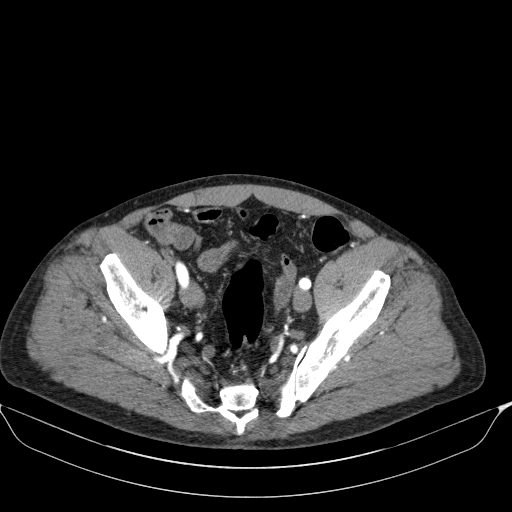
[im 99/231  soft-tissue]
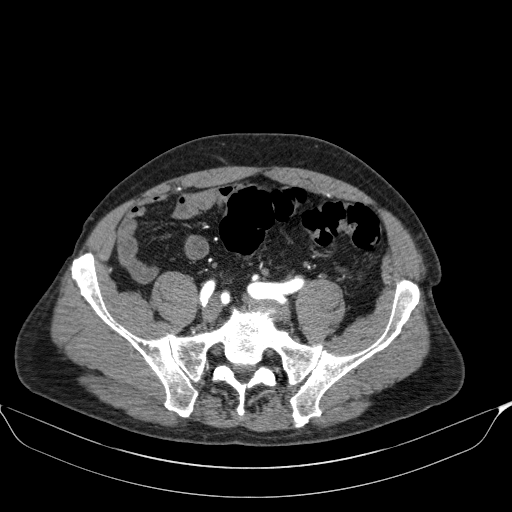
[im 121/231  soft-tissue]
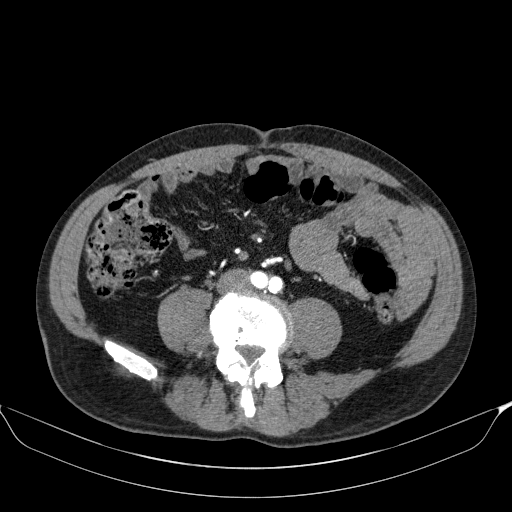
[im 132/231  soft-tissue]
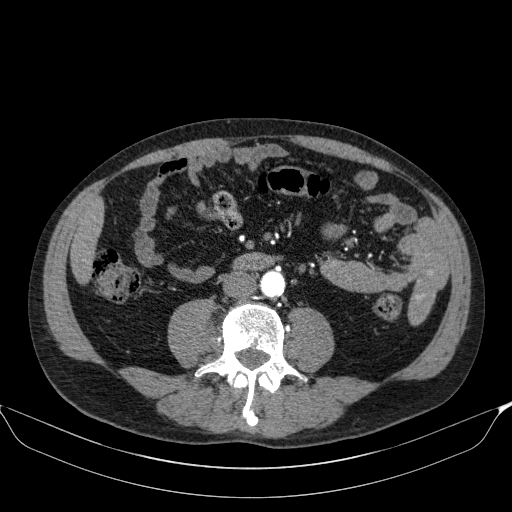
[im 154/231  soft-tissue]
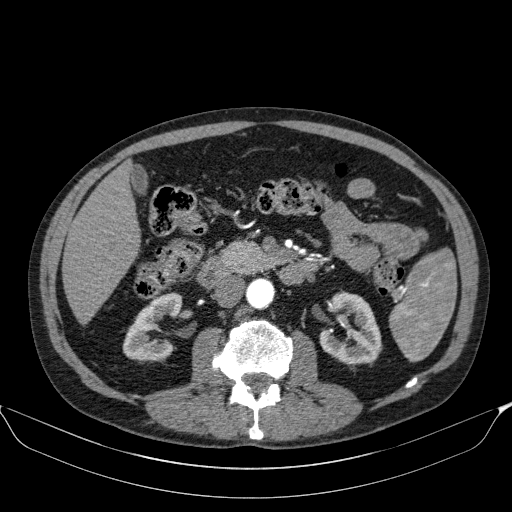
[im 176/231  soft-tissue]
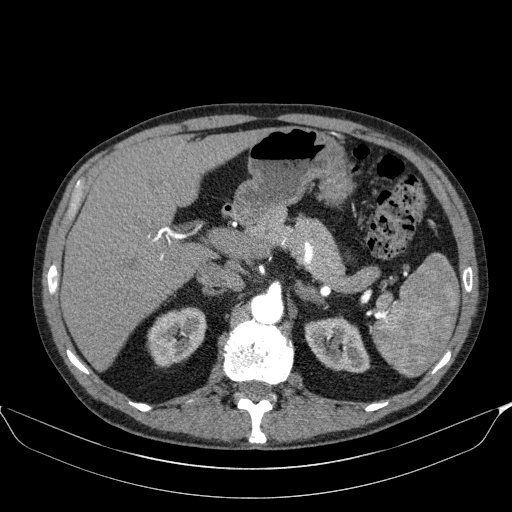
[im 176/231  bone]
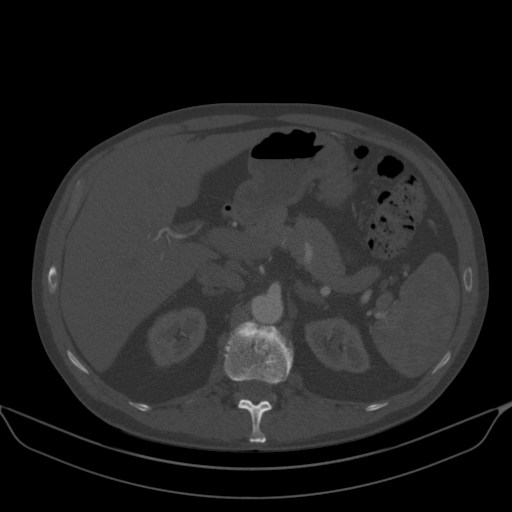
[im 187/231  lung]
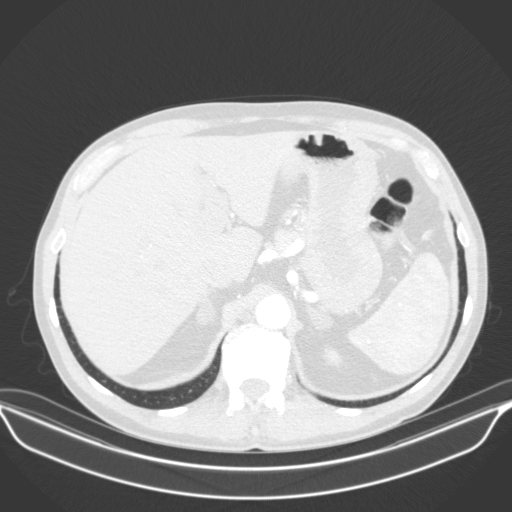
[im 198/231  soft-tissue]
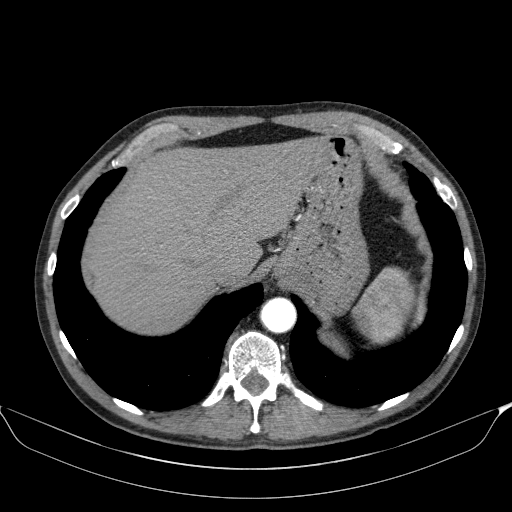
[im 198/231  lung]
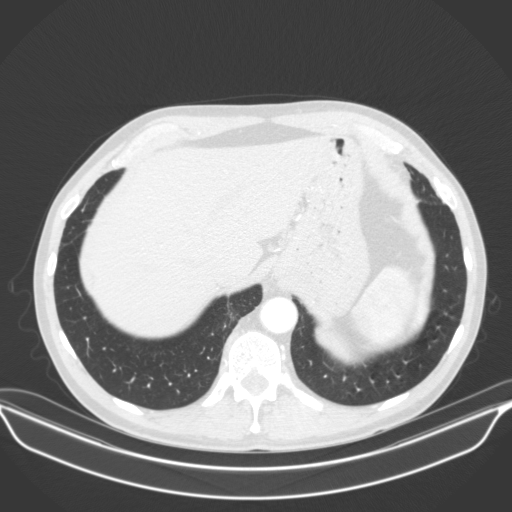
[im 209/231  lung]
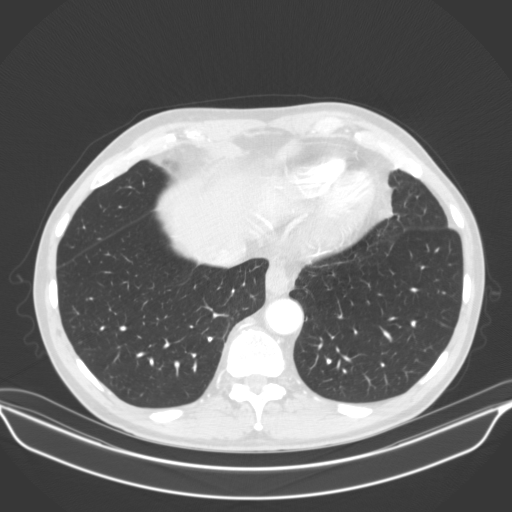
[im 220/231  soft-tissue]
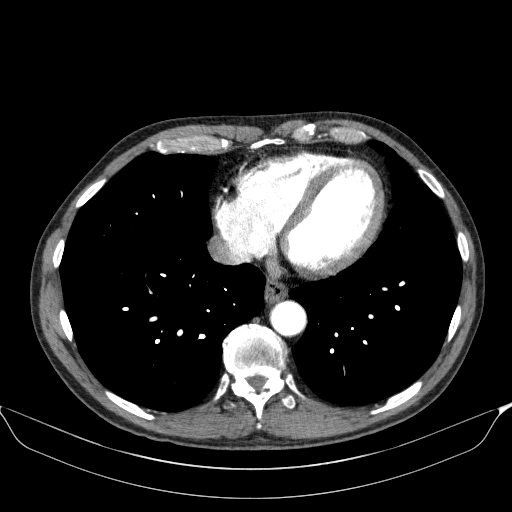
[im 220/231  lung]
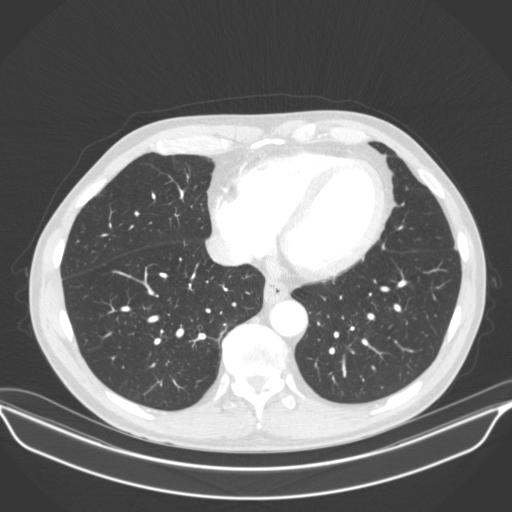

[13 of 32 positions shown; findings below may reference images not displayed]

FINDINGS: VASCULAR

Aorta: Minimal scattered atheromatous plaque of the abdominal aorta
without aneurysm or significant stenosis.

Celiac: Patent without evidence of aneurysm, dissection, vasculitis
or significant stenosis.

SMA: Patent without evidence of aneurysm, dissection, vasculitis or
significant stenosis.

Renals: Both renal arteries are patent without evidence of aneurysm,
dissection, vasculitis, fibromuscular dysplasia or significant
stenosis.

IMA: Patent without evidence of aneurysm, dissection, vasculitis or
significant stenosis.

Inflow: Patent without evidence of aneurysm, dissection, vasculitis
or significant stenosis.

Proximal Outflow: Bilateral common femoral and visualized portions
of the superficial and profunda femoral arteries are patent without
evidence of aneurysm, dissection, vasculitis or significant
stenosis.

Veins: No obvious venous abnormality within the limitations of this
arterial phase study.

Review of the MIP images confirms the above findings.

NON-VASCULAR

Lower chest: No acute abnormality.

Hepatobiliary: No focal liver abnormality is seen. No gallstones,
gallbladder wall thickening, or biliary dilatation.

Pancreas: Unremarkable. No pancreatic ductal dilatation or
surrounding inflammatory changes.

Spleen: Multiple subcentimeter hyperenhancing foci seen within the
spleen on the arterial phase are too small to fully characterize.
These are not seen on the delayed phase.

Adrenals/Urinary Tract: Bilateral low-density adrenal body masses
are consistent with adenomas given their internal density of less
than 10 Hounsfield units. Adrenal glands are otherwise unremarkable.

No significant abnormality of kidneys or ureters. Evaluation the
bladder limited due to streak artifact from hip prosthesis.

Stomach/Bowel: Stomach is within normal limits. Appendix appears
normal. No evidence of bowel wall thickening, distention, or
inflammatory changes.

Lymphatic: No enlarged abdominal or pelvic lymph nodes.

Reproductive: No significant normality.

Other: Small left inguinal hernia containing short segment of
nondilated colon.

Musculoskeletal: Right total hip prosthesis is partially visualized.
Degenerative changes seen throughout the lower lumbar spine.
IMPRESSION: VASCULAR

No abdominal aortic aneurysm.

NON-VASCULAR

1. Bilateral adrenal adenomas.
2. Multiple subcentimeter hyperenhancing foci within the spleen seen
only on the arterial phase are too small to fully characterize.
Follow-up contrast enhanced abdominal MRI should be performed in 3
months to document stability and further evaluate these findings.

These results will be called to the ordering clinician or
representative by the Radiologist Assistant, and communication
documented in the PACS or [REDACTED].

## 2022-02-05 DIAGNOSIS — L821 Other seborrheic keratosis: Secondary | ICD-10-CM | POA: Diagnosis not present

## 2022-02-05 DIAGNOSIS — D2339 Other benign neoplasm of skin of other parts of face: Secondary | ICD-10-CM | POA: Diagnosis not present

## 2022-02-05 DIAGNOSIS — D485 Neoplasm of uncertain behavior of skin: Secondary | ICD-10-CM | POA: Diagnosis not present

## 2022-02-05 DIAGNOSIS — L720 Epidermal cyst: Secondary | ICD-10-CM | POA: Diagnosis not present

## 2022-02-05 DIAGNOSIS — L570 Actinic keratosis: Secondary | ICD-10-CM | POA: Diagnosis not present

## 2022-02-05 DIAGNOSIS — L738 Other specified follicular disorders: Secondary | ICD-10-CM | POA: Diagnosis not present

## 2022-02-05 DIAGNOSIS — D2239 Melanocytic nevi of other parts of face: Secondary | ICD-10-CM | POA: Diagnosis not present

## 2022-02-19 DIAGNOSIS — F3340 Major depressive disorder, recurrent, in remission, unspecified: Secondary | ICD-10-CM | POA: Diagnosis not present

## 2022-02-19 DIAGNOSIS — F411 Generalized anxiety disorder: Secondary | ICD-10-CM | POA: Diagnosis not present

## 2022-04-23 ENCOUNTER — Encounter: Payer: Self-pay | Admitting: Family Medicine

## 2022-04-24 ENCOUNTER — Other Ambulatory Visit: Payer: Medicare Other

## 2022-04-30 DIAGNOSIS — M25511 Pain in right shoulder: Secondary | ICD-10-CM | POA: Diagnosis not present

## 2022-04-30 DIAGNOSIS — M7521 Bicipital tendinitis, right shoulder: Secondary | ICD-10-CM | POA: Diagnosis not present

## 2022-05-17 ENCOUNTER — Ambulatory Visit
Admission: RE | Admit: 2022-05-17 | Discharge: 2022-05-17 | Disposition: A | Discharge status: Home / Self Care | Payer: Medicare Other | Source: Ambulatory Visit | Attending: Family Medicine | Admitting: Family Medicine

## 2022-05-17 DIAGNOSIS — K760 Fatty (change of) liver, not elsewhere classified: Secondary | ICD-10-CM | POA: Diagnosis not present

## 2022-05-17 DIAGNOSIS — D3500 Benign neoplasm of unspecified adrenal gland: Secondary | ICD-10-CM

## 2022-05-17 MED ORDER — GADOPICLENOL 0.5 MMOL/ML IV SOLN
9.0000 mL | Freq: Once | INTRAVENOUS | Status: AC | PRN
  Administered 2022-05-17: 9 mL via INTRAVENOUS

## 2022-05-20 DIAGNOSIS — H52223 Regular astigmatism, bilateral: Secondary | ICD-10-CM | POA: Diagnosis not present

## 2022-05-20 DIAGNOSIS — H35373 Puckering of macula, bilateral: Secondary | ICD-10-CM | POA: Diagnosis not present

## 2022-05-29 DIAGNOSIS — M25561 Pain in right knee: Secondary | ICD-10-CM | POA: Diagnosis not present

## 2022-07-30 DIAGNOSIS — F3181 Bipolar II disorder: Secondary | ICD-10-CM | POA: Diagnosis not present

## 2022-07-30 DIAGNOSIS — I700 Atherosclerosis of aorta: Secondary | ICD-10-CM | POA: Diagnosis not present

## 2022-07-30 DIAGNOSIS — N1831 Chronic kidney disease, stage 3a: Secondary | ICD-10-CM | POA: Diagnosis not present

## 2022-07-30 DIAGNOSIS — E7800 Pure hypercholesterolemia, unspecified: Secondary | ICD-10-CM | POA: Diagnosis not present

## 2022-07-30 DIAGNOSIS — H524 Presbyopia: Secondary | ICD-10-CM | POA: Diagnosis not present

## 2022-07-30 DIAGNOSIS — I129 Hypertensive chronic kidney disease with stage 1 through stage 4 chronic kidney disease, or unspecified chronic kidney disease: Secondary | ICD-10-CM | POA: Diagnosis not present

## 2022-08-01 DIAGNOSIS — F411 Generalized anxiety disorder: Secondary | ICD-10-CM | POA: Diagnosis not present

## 2022-08-01 DIAGNOSIS — F3340 Major depressive disorder, recurrent, in remission, unspecified: Secondary | ICD-10-CM | POA: Diagnosis not present

## 2022-08-29 DIAGNOSIS — F3340 Major depressive disorder, recurrent, in remission, unspecified: Secondary | ICD-10-CM | POA: Diagnosis not present

## 2022-08-29 DIAGNOSIS — F411 Generalized anxiety disorder: Secondary | ICD-10-CM | POA: Diagnosis not present

## 2022-09-06 DIAGNOSIS — M1711 Unilateral primary osteoarthritis, right knee: Secondary | ICD-10-CM | POA: Diagnosis not present

## 2022-09-10 DIAGNOSIS — F4321 Adjustment disorder with depressed mood: Secondary | ICD-10-CM | POA: Diagnosis not present

## 2022-09-10 DIAGNOSIS — Z634 Disappearance and death of family member: Secondary | ICD-10-CM | POA: Diagnosis not present

## 2022-09-18 DIAGNOSIS — Z634 Disappearance and death of family member: Secondary | ICD-10-CM | POA: Diagnosis not present

## 2022-09-18 DIAGNOSIS — F4321 Adjustment disorder with depressed mood: Secondary | ICD-10-CM | POA: Diagnosis not present

## 2022-10-03 DIAGNOSIS — Z634 Disappearance and death of family member: Secondary | ICD-10-CM | POA: Diagnosis not present

## 2022-10-03 DIAGNOSIS — F4321 Adjustment disorder with depressed mood: Secondary | ICD-10-CM | POA: Diagnosis not present

## 2022-10-17 DIAGNOSIS — F4321 Adjustment disorder with depressed mood: Secondary | ICD-10-CM | POA: Diagnosis not present

## 2022-10-17 DIAGNOSIS — Z634 Disappearance and death of family member: Secondary | ICD-10-CM | POA: Diagnosis not present

## 2022-10-31 DIAGNOSIS — Z634 Disappearance and death of family member: Secondary | ICD-10-CM | POA: Diagnosis not present

## 2022-10-31 DIAGNOSIS — F4321 Adjustment disorder with depressed mood: Secondary | ICD-10-CM | POA: Diagnosis not present

## 2022-11-29 DIAGNOSIS — Z8546 Personal history of malignant neoplasm of prostate: Secondary | ICD-10-CM | POA: Diagnosis not present

## 2022-11-29 DIAGNOSIS — D692 Other nonthrombocytopenic purpura: Secondary | ICD-10-CM | POA: Diagnosis not present

## 2022-11-29 DIAGNOSIS — I129 Hypertensive chronic kidney disease with stage 1 through stage 4 chronic kidney disease, or unspecified chronic kidney disease: Secondary | ICD-10-CM | POA: Diagnosis not present

## 2022-11-29 DIAGNOSIS — Z1331 Encounter for screening for depression: Secondary | ICD-10-CM | POA: Diagnosis not present

## 2022-11-29 DIAGNOSIS — Z0000 Encounter for general adult medical examination without abnormal findings: Secondary | ICD-10-CM | POA: Diagnosis not present

## 2022-11-29 DIAGNOSIS — F3181 Bipolar II disorder: Secondary | ICD-10-CM | POA: Diagnosis not present

## 2022-11-29 DIAGNOSIS — I700 Atherosclerosis of aorta: Secondary | ICD-10-CM | POA: Diagnosis not present

## 2022-11-29 DIAGNOSIS — E7800 Pure hypercholesterolemia, unspecified: Secondary | ICD-10-CM | POA: Diagnosis not present

## 2022-11-29 DIAGNOSIS — N1831 Chronic kidney disease, stage 3a: Secondary | ICD-10-CM | POA: Diagnosis not present

## 2022-12-05 DIAGNOSIS — F430 Acute stress reaction: Secondary | ICD-10-CM | POA: Diagnosis not present

## 2022-12-18 DIAGNOSIS — Z634 Disappearance and death of family member: Secondary | ICD-10-CM | POA: Diagnosis not present

## 2022-12-18 DIAGNOSIS — F4321 Adjustment disorder with depressed mood: Secondary | ICD-10-CM | POA: Diagnosis not present

## 2022-12-31 DIAGNOSIS — F430 Acute stress reaction: Secondary | ICD-10-CM | POA: Diagnosis not present

## 2023-01-01 DIAGNOSIS — L820 Inflamed seborrheic keratosis: Secondary | ICD-10-CM | POA: Diagnosis not present

## 2023-01-07 DIAGNOSIS — F3340 Major depressive disorder, recurrent, in remission, unspecified: Secondary | ICD-10-CM | POA: Diagnosis not present

## 2023-01-07 DIAGNOSIS — F411 Generalized anxiety disorder: Secondary | ICD-10-CM | POA: Diagnosis not present

## 2023-01-22 DIAGNOSIS — Z634 Disappearance and death of family member: Secondary | ICD-10-CM | POA: Diagnosis not present

## 2023-01-22 DIAGNOSIS — F4321 Adjustment disorder with depressed mood: Secondary | ICD-10-CM | POA: Diagnosis not present

## 2023-04-23 DIAGNOSIS — F3340 Major depressive disorder, recurrent, in remission, unspecified: Secondary | ICD-10-CM | POA: Diagnosis not present

## 2023-04-23 DIAGNOSIS — F411 Generalized anxiety disorder: Secondary | ICD-10-CM | POA: Diagnosis not present

## 2023-05-20 DIAGNOSIS — H52223 Regular astigmatism, bilateral: Secondary | ICD-10-CM | POA: Diagnosis not present

## 2023-05-28 DIAGNOSIS — J209 Acute bronchitis, unspecified: Secondary | ICD-10-CM | POA: Diagnosis not present

## 2023-07-21 DIAGNOSIS — F3340 Major depressive disorder, recurrent, in remission, unspecified: Secondary | ICD-10-CM | POA: Diagnosis not present

## 2023-07-21 DIAGNOSIS — F411 Generalized anxiety disorder: Secondary | ICD-10-CM | POA: Diagnosis not present

## 2023-09-22 DIAGNOSIS — M25461 Effusion, right knee: Secondary | ICD-10-CM | POA: Diagnosis not present

## 2023-09-22 DIAGNOSIS — M1711 Unilateral primary osteoarthritis, right knee: Secondary | ICD-10-CM | POA: Diagnosis not present

## 2023-11-05 DIAGNOSIS — M25561 Pain in right knee: Secondary | ICD-10-CM | POA: Diagnosis not present

## 2023-11-05 DIAGNOSIS — R300 Dysuria: Secondary | ICD-10-CM | POA: Diagnosis not present

## 2023-11-05 DIAGNOSIS — R350 Frequency of micturition: Secondary | ICD-10-CM | POA: Diagnosis not present

## 2023-11-05 DIAGNOSIS — Z87442 Personal history of urinary calculi: Secondary | ICD-10-CM | POA: Diagnosis not present

## 2023-11-25 DIAGNOSIS — M25551 Pain in right hip: Secondary | ICD-10-CM | POA: Diagnosis not present

## 2023-12-01 DIAGNOSIS — Z96641 Presence of right artificial hip joint: Secondary | ICD-10-CM | POA: Diagnosis not present

## 2023-12-01 DIAGNOSIS — M25561 Pain in right knee: Secondary | ICD-10-CM | POA: Diagnosis not present

## 2023-12-01 DIAGNOSIS — M25551 Pain in right hip: Secondary | ICD-10-CM | POA: Diagnosis not present

## 2023-12-03 DIAGNOSIS — Z1331 Encounter for screening for depression: Secondary | ICD-10-CM | POA: Diagnosis not present

## 2023-12-03 DIAGNOSIS — K219 Gastro-esophageal reflux disease without esophagitis: Secondary | ICD-10-CM | POA: Diagnosis not present

## 2023-12-03 DIAGNOSIS — Z0000 Encounter for general adult medical examination without abnormal findings: Secondary | ICD-10-CM | POA: Diagnosis not present

## 2023-12-03 DIAGNOSIS — Z8546 Personal history of malignant neoplasm of prostate: Secondary | ICD-10-CM | POA: Diagnosis not present

## 2023-12-03 DIAGNOSIS — N1831 Chronic kidney disease, stage 3a: Secondary | ICD-10-CM | POA: Diagnosis not present

## 2023-12-03 DIAGNOSIS — I129 Hypertensive chronic kidney disease with stage 1 through stage 4 chronic kidney disease, or unspecified chronic kidney disease: Secondary | ICD-10-CM | POA: Diagnosis not present

## 2023-12-03 DIAGNOSIS — Z08 Encounter for follow-up examination after completed treatment for malignant neoplasm: Secondary | ICD-10-CM | POA: Diagnosis not present

## 2023-12-03 DIAGNOSIS — E7800 Pure hypercholesterolemia, unspecified: Secondary | ICD-10-CM | POA: Diagnosis not present

## 2024-01-21 DIAGNOSIS — F322 Major depressive disorder, single episode, severe without psychotic features: Secondary | ICD-10-CM | POA: Diagnosis not present

## 2024-02-05 DIAGNOSIS — M1711 Unilateral primary osteoarthritis, right knee: Secondary | ICD-10-CM | POA: Diagnosis not present

## 2024-02-24 DIAGNOSIS — F3340 Major depressive disorder, recurrent, in remission, unspecified: Secondary | ICD-10-CM | POA: Diagnosis not present

## 2024-02-24 DIAGNOSIS — F411 Generalized anxiety disorder: Secondary | ICD-10-CM | POA: Diagnosis not present

## 2024-02-24 DIAGNOSIS — M545 Low back pain, unspecified: Secondary | ICD-10-CM | POA: Diagnosis not present

## 2024-02-24 DIAGNOSIS — M533 Sacrococcygeal disorders, not elsewhere classified: Secondary | ICD-10-CM | POA: Diagnosis not present

## 2024-02-28 ENCOUNTER — Other Ambulatory Visit: Payer: Self-pay | Admitting: Physician Assistant

## 2024-02-28 DIAGNOSIS — M533 Sacrococcygeal disorders, not elsewhere classified: Secondary | ICD-10-CM

## 2024-03-08 DIAGNOSIS — M5459 Other low back pain: Secondary | ICD-10-CM | POA: Diagnosis not present

## 2024-03-10 ENCOUNTER — Ambulatory Visit
Admission: RE | Admit: 2024-03-10 | Discharge: 2024-03-10 | Disposition: A | Source: Ambulatory Visit | Attending: Physician Assistant | Admitting: Physician Assistant

## 2024-03-10 ENCOUNTER — Ambulatory Visit
Admission: RE | Admit: 2024-03-10 | Discharge: 2024-03-10 | Disposition: A | Discharge status: Home / Self Care | Source: Ambulatory Visit | Attending: Physician Assistant | Admitting: Physician Assistant

## 2024-03-10 DIAGNOSIS — M533 Sacrococcygeal disorders, not elsewhere classified: Secondary | ICD-10-CM | POA: Diagnosis not present

## 2024-03-15 DIAGNOSIS — M5459 Other low back pain: Secondary | ICD-10-CM | POA: Diagnosis not present

## 2024-03-15 DIAGNOSIS — M7061 Trochanteric bursitis, right hip: Secondary | ICD-10-CM | POA: Diagnosis not present

## 2024-03-15 DIAGNOSIS — Z96641 Presence of right artificial hip joint: Secondary | ICD-10-CM | POA: Diagnosis not present

## 2024-03-29 DIAGNOSIS — M5459 Other low back pain: Secondary | ICD-10-CM | POA: Diagnosis not present

## 2024-04-05 DIAGNOSIS — M5459 Other low back pain: Secondary | ICD-10-CM | POA: Diagnosis not present

## 2024-04-13 ENCOUNTER — Encounter (HOSPITAL_COMMUNITY): Payer: Self-pay | Admitting: Surgery

## 2024-04-21 DIAGNOSIS — M25551 Pain in right hip: Secondary | ICD-10-CM | POA: Diagnosis not present
